# Patient Record
Sex: Female | Born: 1940 | Race: Black or African American | Hispanic: No | Smoking: Never smoker
Health system: Southern US, Community
[De-identification: ages and names within clinical notes are randomized; demographics above are authoritative.]

## PROBLEM LIST (undated history)

## (undated) DIAGNOSIS — E119 Type 2 diabetes mellitus without complications: Secondary | ICD-10-CM

## (undated) DIAGNOSIS — I10 Essential (primary) hypertension: Secondary | ICD-10-CM

## (undated) HISTORY — PX: ABDOMINAL HYSTERECTOMY: SHX81

---

## 2000-05-02 ENCOUNTER — Encounter: Payer: Self-pay | Admitting: Obstetrics

## 2000-05-02 ENCOUNTER — Encounter: Admission: RE | Admit: 2000-05-02 | Discharge: 2000-05-02 | Payer: Self-pay | Admitting: Obstetrics

## 2002-11-25 ENCOUNTER — Ambulatory Visit (HOSPITAL_COMMUNITY): Admission: RE | Admit: 2002-11-25 | Discharge: 2002-11-25 | Payer: Self-pay | Admitting: Gastroenterology

## 2002-12-02 ENCOUNTER — Encounter: Payer: Self-pay | Admitting: Gastroenterology

## 2002-12-02 ENCOUNTER — Encounter: Admission: RE | Admit: 2002-12-02 | Discharge: 2002-12-02 | Payer: Self-pay | Admitting: Gastroenterology

## 2007-08-13 ENCOUNTER — Encounter: Admission: RE | Admit: 2007-08-13 | Discharge: 2007-08-13 | Payer: Self-pay | Admitting: Internal Medicine

## 2008-08-13 ENCOUNTER — Encounter: Admission: RE | Admit: 2008-08-13 | Discharge: 2008-08-13 | Payer: Self-pay | Admitting: Internal Medicine

## 2008-10-23 ENCOUNTER — Emergency Department (HOSPITAL_COMMUNITY): Admission: EM | Admit: 2008-10-23 | Discharge: 2008-10-23 | Payer: Self-pay | Admitting: Emergency Medicine

## 2008-11-01 ENCOUNTER — Emergency Department (HOSPITAL_COMMUNITY): Admission: EM | Admit: 2008-11-01 | Discharge: 2008-11-01 | Payer: Self-pay | Admitting: Family Medicine

## 2008-11-20 ENCOUNTER — Emergency Department (HOSPITAL_COMMUNITY): Admission: EM | Admit: 2008-11-20 | Discharge: 2008-11-20 | Payer: Self-pay | Admitting: Family Medicine

## 2009-08-16 ENCOUNTER — Encounter: Admission: RE | Admit: 2009-08-16 | Discharge: 2009-08-16 | Payer: Self-pay | Admitting: Internal Medicine

## 2009-09-23 ENCOUNTER — Encounter: Admission: RE | Admit: 2009-09-23 | Discharge: 2009-09-23 | Payer: Self-pay | Admitting: Internal Medicine

## 2010-09-24 ENCOUNTER — Encounter: Payer: Self-pay | Admitting: Specialist

## 2011-01-19 NOTE — Op Note (Signed)
   NAME:  Kimberly Simmons, Kimberly Simmons                          ACCOUNT NO.:  [id number]   MEDICAL RECORD NO.:  [id number]                   PATIENT TYPE:  AMB   LOCATION:  ENDO                                 FACILITY:  [facility]   PHYSICIAN:  Anselmo Rod, M.D.               DATE OF BIRTH:  Mar 28, 1941   DATE OF PROCEDURE:  11/25/2002  DATE OF DISCHARGE:                                 OPERATIVE REPORT   PROCEDURE PERFORMED:  Colonoscopy.   ENDOSCOPIST:  Charna Elizabeth, M.D.   INSTRUMENT USED:  Olympus video colonoscope.   INDICATIONS FOR PROCEDURE:  The patient is a 70 year old African-American  female with a history of blood in stool.  Rule out colonic polyps, masses,  etc.   PREPROCEDURE PREPARATION:  Informed consent was procured from the patient.  The patient was fasted for eight hours prior to the procedure and prepped  with a bottle of magnesium citrate and a gallon of GoLYTELY the night prior  to the procedure.   PREPROCEDURE PHYSICAL:  The patient had stable vital signs.  Neck supple.  Chest clear to auscultation.  S1 and S2 regular.  No murmurs, rubs or  gallops, rales, rhonchi or wheezing.  Abdomen soft with normal bowel sounds.   DESCRIPTION OF PROCEDURE:  The patient was placed in left lateral decubitus  position and sedated with 50 mg of Demerol and 5 mg of Versed intravenously.  Once the patient was adequately sedated and maintained on low flow oxygen  and continuous cardiac monitoring, the Olympus video colonoscope was  advanced from the rectum to the cecum with difficulty.  There was a large  amount of residual stool in the colon.  Multiple washes were done.  There  were a few left-sided diverticula seen.  No masses, polyps, erosions or  ulcerations were appreciated.  The appendicular orifice and ileocecal valve  were clearly visualized and photographed.   IMPRESSION:  1. 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  2. Some  residual stool in the colon.  Small lesions could have been missed.    RECOMMENDATIONS:  An upper GI with small bowel follow-through has been  planned for the patient to complete her GI evaluation.  Further  recommendations will be made thereafter.                                                   Anselmo Rod, M.D.    JNM/MEDQ  D:  11/25/2002  T:  11/26/2002  Job:  [id number]   cc:   Merlene Laughter. Renae Gloss, M.D.  [address]  [address]  [place]  [place] [address]  Fax: [phone]

## 2013-04-11 ENCOUNTER — Encounter (HOSPITAL_COMMUNITY): Payer: Self-pay | Admitting: *Deleted

## 2013-04-11 ENCOUNTER — Emergency Department (HOSPITAL_COMMUNITY)
Admission: EM | Admit: 2013-04-11 | Discharge: 2013-04-11 | Disposition: A | Discharge status: Home / Self Care | Payer: 59 | Source: Home / Self Care | Attending: Family Medicine | Admitting: Family Medicine

## 2013-04-11 DIAGNOSIS — M5430 Sciatica, unspecified side: Secondary | ICD-10-CM

## 2013-04-11 HISTORY — DX: Essential (primary) hypertension: I10

## 2013-04-11 HISTORY — DX: Type 2 diabetes mellitus without complications: E11.9

## 2013-04-11 MED ORDER — HYDROCODONE-ACETAMINOPHEN 7.5-325 MG PO TABS: 1.0000 | ORAL_TABLET | Freq: Four times a day (QID) | ORAL | Status: DC | PRN

## 2013-04-11 NOTE — ED Provider Notes (Signed)
CSN: [id number]     Arrival date & time 04/11/13  1241 History     First MD Initiated Contact with Patient 04/11/13 1301     Chief Complaint  Patient presents with  . Back Pain   (Consider location/radiation/quality/duration/timing/severity/associated sxs/prior Treatment) HPI Comments: 72 year old female complaining of worsening back pain shooting down both legs for the past week.  this pain has been present for the past 2 years but has been a lot worse in the past week. She saw her primary care physician about this 3 weeks ago and was referred to a pain and spine specialist, she has an appointment scheduled for one month from now. However, she says the pain is getting to be unbearable and is keeping her from sleeping. The pain starts in the lower back and radiates through the buttocks down to the heel bilaterally. The pain is constant but is worse and by any activity. She denies any numbness or loss of bowel/bladder control. No recent injuries. No other systemic symptoms including fever, chills, diffuse back pain, nausea, vomiting, rash, or headache   Patient is a 72 y.o. female presenting with back pain.  Back Pain Associated symptoms: no abdominal pain, no chest pain, no dysuria, no fever, no pelvic pain and no weakness     Past Medical History  Diagnosis Date  . Hypertension   . Diabetes mellitus without complication    History reviewed. No pertinent past surgical history. No family history on file. History  Substance Use Topics  . Smoking status: Not on file  . Smokeless tobacco: Not on file  . Alcohol Use: No   OB History   Grav Para Term Preterm Abortions TAB SAB Ect Mult Living                 Review of Systems  Constitutional: Negative for fever and chills.  Eyes: Negative for visual disturbance.  Respiratory: Negative for cough and shortness of breath.   Cardiovascular: Negative for chest pain, palpitations and leg swelling.  Gastrointestinal: Negative for nausea,  vomiting, abdominal pain, diarrhea and constipation.  Endocrine: Negative for polydipsia and polyuria.  Genitourinary: Negative for dysuria, urgency, frequency, hematuria, flank pain and pelvic pain.  Musculoskeletal: Positive for myalgias and back pain. Negative for arthralgias.  Skin: Negative for rash.  Neurological: Negative for dizziness, weakness and light-headedness.    Allergies  Review of patient's allergies indicates no known allergies.  Home Medications   Current Outpatient Rx  Name  Route  Sig  Dispense  Refill  . Cholecalciferol (VITAMIN D PO)   Oral   Take by mouth.         . cloNIDine (CATAPRES) 0.1 MG tablet   Oral   Take 0.1 mg by mouth at bedtime.         . fish oil-omega-3 fatty acids 1000 MG capsule   Oral   Take 2 g by mouth daily.         Marland Kitchen METFORMIN HCL PO   Oral   Take by mouth.         . olmesartan-hydrochlorothiazide (BENICAR HCT) 40-25 MG per tablet   Oral   Take 1 tablet by mouth daily.         . Pregabalin (LYRICA PO)   Oral   Take by mouth.         Marland Kitchen UNKNOWN TO PATIENT      Bone health supplement         . HYDROcodone-acetaminophen (NORCO) 7.5-325 MG per  tablet   Oral   Take 1 tablet by mouth every 6 (six) hours as needed for pain.   20 tablet   0    BP 130/75  Pulse 78  Temp(Src) 98.5 F (36.9 C) (Oral)  SpO2 96% Physical Exam  Nursing note and vitals reviewed. Constitutional: She is oriented to person, place, and time. She appears well-developed and well-nourished. No distress.  Musculoskeletal:       Lumbar back: She exhibits decreased range of motion, bony tenderness and pain. She exhibits no swelling, no deformity and no spasm.  Positive SLR bilaterally, negative XSLR bilaterally   Neurological: She is alert and oriented to person, place, and time. She displays normal reflexes. She exhibits normal muscle tone. Coordination normal.  Skin: Skin is warm. She is not diaphoretic.  Psychiatric: She has a normal  mood and affect. Judgment normal.    ED Course   Procedures (including critical care time)  Labs Reviewed - No data to display No results found. 1. Sciatica, unspecified laterality     MDM  She already has a f/u appt with back specialist, she will call to try to get her appt moved up.  Will discharge with pain control.  She will also start taking her lyrica BID.     Meds ordered this encounter  Medications                                                                  . HYDROcodone-acetaminophen (NORCO) 7.5-325 MG per tablet    Sig: Take 1 tablet by mouth every 6 (six) hours as needed for pain.    Dispense:  20 tablet    Refill:  0    Graylon Good, PA-C 04/11/13 1333

## 2013-04-11 NOTE — ED Notes (Signed)
Denies injury.  C/O "nerve" pain in bilat legs x approx 2 yrs without work-up, but over past 2 wks low back pain radiating down bilat buttocks and legs (R>L) has gotten worse.  Has appt with Dr. Jana Hakim on Sept 3.  States unable to sleep well due to pain; painful laying down and sitting.  Denies any parasthesias.

## 2013-04-12 NOTE — ED Provider Notes (Signed)
Medical screening examination/treatment/procedure(s) were performed by non-physician practitioner and as supervising physician I was immediately available for consultation/collaboration.   [facility]; MD  Sharin Grave, MD 04/12/13 1010

## 2014-06-02 ENCOUNTER — Encounter (INDEPENDENT_AMBULATORY_CARE_PROVIDER_SITE_OTHER): Payer: Medicare Other | Admitting: Ophthalmology

## 2014-06-02 DIAGNOSIS — I10 Essential (primary) hypertension: Secondary | ICD-10-CM | POA: Diagnosis not present

## 2014-06-02 DIAGNOSIS — H35039 Hypertensive retinopathy, unspecified eye: Secondary | ICD-10-CM

## 2014-06-02 DIAGNOSIS — H2510 Age-related nuclear cataract, unspecified eye: Secondary | ICD-10-CM

## 2014-06-02 DIAGNOSIS — H43819 Vitreous degeneration, unspecified eye: Secondary | ICD-10-CM

## 2014-06-02 DIAGNOSIS — H348192 Central retinal vein occlusion, unspecified eye, stable: Secondary | ICD-10-CM

## 2014-06-30 ENCOUNTER — Encounter (INDEPENDENT_AMBULATORY_CARE_PROVIDER_SITE_OTHER): Payer: Medicare Other | Admitting: Ophthalmology

## 2014-06-30 DIAGNOSIS — H2513 Age-related nuclear cataract, bilateral: Secondary | ICD-10-CM

## 2014-06-30 DIAGNOSIS — H35033 Hypertensive retinopathy, bilateral: Secondary | ICD-10-CM | POA: Diagnosis not present

## 2014-06-30 DIAGNOSIS — I10 Essential (primary) hypertension: Secondary | ICD-10-CM | POA: Diagnosis not present

## 2014-06-30 DIAGNOSIS — H34812 Central retinal vein occlusion, left eye: Secondary | ICD-10-CM | POA: Diagnosis not present

## 2014-06-30 DIAGNOSIS — H43813 Vitreous degeneration, bilateral: Secondary | ICD-10-CM | POA: Diagnosis not present

## 2014-07-28 ENCOUNTER — Encounter (INDEPENDENT_AMBULATORY_CARE_PROVIDER_SITE_OTHER): Payer: Medicare Other | Admitting: Ophthalmology

## 2014-07-28 DIAGNOSIS — H34812 Central retinal vein occlusion, left eye: Secondary | ICD-10-CM | POA: Diagnosis not present

## 2014-07-28 DIAGNOSIS — H35033 Hypertensive retinopathy, bilateral: Secondary | ICD-10-CM

## 2014-07-28 DIAGNOSIS — I10 Essential (primary) hypertension: Secondary | ICD-10-CM | POA: Diagnosis not present

## 2014-07-28 DIAGNOSIS — H43813 Vitreous degeneration, bilateral: Secondary | ICD-10-CM

## 2014-08-31 ENCOUNTER — Encounter (INDEPENDENT_AMBULATORY_CARE_PROVIDER_SITE_OTHER): Payer: Medicare Other | Admitting: Ophthalmology

## 2014-08-31 DIAGNOSIS — H35033 Hypertensive retinopathy, bilateral: Secondary | ICD-10-CM | POA: Diagnosis not present

## 2014-08-31 DIAGNOSIS — H43813 Vitreous degeneration, bilateral: Secondary | ICD-10-CM

## 2014-08-31 DIAGNOSIS — H34811 Central retinal vein occlusion, right eye: Secondary | ICD-10-CM | POA: Diagnosis not present

## 2014-08-31 DIAGNOSIS — I10 Essential (primary) hypertension: Secondary | ICD-10-CM

## 2014-10-02 ENCOUNTER — Emergency Department (HOSPITAL_COMMUNITY)
Admission: EM | Admit: 2014-10-02 | Discharge: 2014-10-02 | Disposition: A | Discharge status: Home / Self Care | Payer: Commercial Managed Care - PPO | Source: Home / Self Care | Attending: Emergency Medicine | Admitting: Emergency Medicine

## 2014-10-02 ENCOUNTER — Encounter (HOSPITAL_COMMUNITY): Payer: Self-pay | Admitting: *Deleted

## 2014-10-02 DIAGNOSIS — J019 Acute sinusitis, unspecified: Secondary | ICD-10-CM

## 2014-10-02 MED ORDER — CEFDINIR 300 MG PO CAPS: 300.0000 mg | ORAL_CAPSULE | Freq: Two times a day (BID) | ORAL | Status: DC

## 2014-10-02 MED ORDER — FLUTICASONE PROPIONATE 50 MCG/ACT NA SUSP: 2.0000 | Freq: Every day | NASAL | Status: DC

## 2014-10-02 MED ORDER — BENZONATATE 200 MG PO CAPS: 200.0000 mg | ORAL_CAPSULE | Freq: Three times a day (TID) | ORAL | Status: DC | PRN

## 2014-10-02 NOTE — Discharge Instructions (Signed)

## 2014-10-02 NOTE — ED Provider Notes (Signed)
   Chief Complaint   URI   History of Present Illness   Kimberly Simmons is a 74 year old female who has had a one-week history of nasal congestion with clear, bloody drainage, headache, sinus pressure, sore throat, postnasal drainage, and cough productive of clear sputum. She denies any fever, chills, headache, stiff neck, difficulty breathing, or GI symptoms.  Review of Systems   Other than as noted above, the patient denies any of the following symptoms: Systemic:  No fevers, chills, sweats, or myalgias. Eye:  No redness or discharge. ENT:  No ear pain, headache, nasal congestion, drainage, sinus pressure, or sore throat. Neck:  No neck pain, stiffness, or swollen glands. Lungs:  No cough, sputum production, hemoptysis, wheezing, chest tightness, shortness of breath or chest pain. GI:  No abdominal pain, nausea, vomiting or diarrhea.  PMFSH   Past medical history, family history, social history, meds, and allergies were reviewed. She has high blood pressure and arthritis. She takes clonidine and Benicar.  Physical exam   Vital signs:  BP 188/81 mmHg  Pulse 83  Temp(Src) 98.8 F (37.1 C) (Oral)  Resp 16  SpO2 99% General:  Alert and oriented.  In no distress.  Skin warm and dry. Eye:  No conjunctival injection or drainage. Lids were normal. ENT:  TMs and canals were normal, without erythema or inflammation.  Nasal mucosa was clear and uncongested, without drainage.  Mucous membranes were moist.  Pharynx was clear with no exudate or drainage.  There were no oral ulcerations or lesions. Neck:  Supple, no adenopathy, tenderness or mass. Lungs:  No respiratory distress.  Lungs were clear to auscultation, without wheezes, rales or rhonchi.  Breath sounds were clear and equal bilaterally.  Heart:  Regular rhythm, without gallops, murmers or rubs. Skin:  Clear, warm, and dry, without rash or lesions.  Assessment     The encounter diagnosis was Acute sinusitis, recurrence not  specified, unspecified location.  There is no evidence of pneumonia, strep throat, otitis media.    Plan    1.  Meds:  The following meds were prescribed:   Discharge Medication List as of 10/02/2014 11:53 AM    START taking these medications   Details  benzonatate (TESSALON) 200 MG capsule Take 1 capsule (200 mg total) by mouth 3 (three) times daily as needed for cough., Starting 10/02/2014, Until Discontinued, Normal    cefdinir (OMNICEF) 300 MG capsule Take 1 capsule (300 mg total) by mouth 2 (two) times daily., Starting 10/02/2014, Until Discontinued, Normal    fluticasone (FLONASE) 50 MCG/ACT nasal spray Place 2 sprays into both nostrils daily., Starting 10/02/2014, Until Discontinued, Normal        2.  Patient Education/Counseling:  The patient was given appropriate handouts, self care instructions, and instructed in symptomatic relief.  Instructed to get extra fluids and extra rest.    3.  Follow up:  The patient was told to follow up here if no better in 3 to 4 days, or sooner if becoming worse in any way, and given some red flag symptoms such as increasing fever, difficulty breathing, chest pain, or persistent vomiting which would prompt immediate return.       Reuben Likesavid C Jacquilyn Seldon, MD 10/02/14 2209

## 2014-10-02 NOTE — ED Notes (Signed)
Pt  Reports  Symptoms  Of  A  sorethroat     Sneezing         Sinus  Drainage    /  Congestion        -       With a  Slight  Cough  As  Well       Symptoms  X  5  Days        Pt  Sitting upright on  Exam table  Speaking in  Complete  sentances  And  Is  In no  Severe  Distress

## 2014-10-07 ENCOUNTER — Encounter (INDEPENDENT_AMBULATORY_CARE_PROVIDER_SITE_OTHER): Payer: Medicare Other | Admitting: Ophthalmology

## 2014-10-07 DIAGNOSIS — H35033 Hypertensive retinopathy, bilateral: Secondary | ICD-10-CM

## 2014-10-07 DIAGNOSIS — H43813 Vitreous degeneration, bilateral: Secondary | ICD-10-CM | POA: Diagnosis not present

## 2014-10-07 DIAGNOSIS — I10 Essential (primary) hypertension: Secondary | ICD-10-CM

## 2014-10-07 DIAGNOSIS — H34811 Central retinal vein occlusion, right eye: Secondary | ICD-10-CM

## 2014-10-07 DIAGNOSIS — H2513 Age-related nuclear cataract, bilateral: Secondary | ICD-10-CM

## 2014-10-15 ENCOUNTER — Emergency Department (HOSPITAL_COMMUNITY)
Admission: EM | Admit: 2014-10-15 | Discharge: 2014-10-15 | Disposition: A | Discharge status: Home / Self Care | Payer: Medicare Other | Source: Home / Self Care | Attending: Emergency Medicine | Admitting: Emergency Medicine

## 2014-10-15 ENCOUNTER — Encounter (HOSPITAL_COMMUNITY): Payer: Self-pay | Admitting: Emergency Medicine

## 2014-10-15 DIAGNOSIS — J40 Bronchitis, not specified as acute or chronic: Secondary | ICD-10-CM | POA: Diagnosis not present

## 2014-10-15 MED ORDER — METHYLPREDNISOLONE SODIUM SUCC 125 MG IJ SOLR
INTRAMUSCULAR | Status: AC
  Filled 2014-10-15: qty 2

## 2014-10-15 MED ORDER — METHYLPREDNISOLONE SODIUM SUCC 125 MG IJ SOLR
60.0000 mg | Freq: Once | INTRAMUSCULAR | Status: AC
  Administered 2014-10-15: 60 mg via INTRAMUSCULAR

## 2014-10-15 MED ORDER — PREDNISONE 50 MG PO TABS: ORAL_TABLET | ORAL | Status: DC

## 2014-10-15 NOTE — ED Provider Notes (Signed)
CSN: [id number]     Arrival date & time 10/15/14  1111 History   First MD Initiated Contact with Patient 10/15/14 1129     Chief Complaint  Patient presents with  . Nasal Congestion   (Consider location/radiation/quality/duration/timing/severity/associated sxs/prior Treatment) HPI  She is a 74 year old woman here for follow-up of cough and congestion. Her symptoms have been ongoing for 2-3 weeks. She was seen here about 10 days ago and treated for sinusitis with Omnicef and Flonase. She states her nasal symptoms are somewhat better, but her cough is persistent. She also describes chest congestion. She has just started using the Tessalon prescribed previously. No fevers or chills. No nausea or vomiting.  Past Medical History  Diagnosis Date  . Hypertension   . Diabetes mellitus without complication    History reviewed. No pertinent past surgical history. History reviewed. No pertinent family history. History  Substance Use Topics  . Smoking status: Never Smoker   . Smokeless tobacco: Not on file  . Alcohol Use: No   OB History    No data available     Review of Systems  Constitutional: Negative for fever and chills.  HENT: Positive for congestion, postnasal drip and rhinorrhea. Negative for sore throat.   Respiratory: Positive for cough. Negative for shortness of breath.   Gastrointestinal: Negative for nausea and vomiting.    Allergies  Review of patient's allergies indicates no known allergies.  Home Medications   Prior to Admission medications   Medication Sig Start Date End Date Taking? Authorizing Provider  benzonatate (TESSALON) 200 MG capsule Take 1 capsule (200 mg total) by mouth 3 (three) times daily as needed for cough. 10/02/14   Reuben Likes, MD  cefdinir (OMNICEF) 300 MG capsule Take 1 capsule (300 mg total) by mouth 2 (two) times daily. 10/02/14   Reuben Likes, MD  Cholecalciferol (VITAMIN D PO) Take by mouth.    Historical Provider, MD  cloNIDine  (CATAPRES) 0.1 MG tablet Take 0.1 mg by mouth at bedtime.    Historical Provider, MD  fish oil-omega-3 fatty acids 1000 MG capsule Take 2 g by mouth daily.    Historical Provider, MD  fluticasone (FLONASE) 50 MCG/ACT nasal spray Place 2 sprays into both nostrils daily. 10/02/14   Reuben Likes, MD  HYDROcodone-acetaminophen (NORCO) 7.5-325 MG per tablet Take 1 tablet by mouth every 6 (six) hours as needed for pain. 04/11/13   Adrian Blackwater Baker, PA-C  METFORMIN HCL PO Take by mouth.    Historical Provider, MD  olmesartan-hydrochlorothiazide (BENICAR HCT) 40-25 MG per tablet Take 1 tablet by mouth daily.    Historical Provider, MD  predniSONE (DELTASONE) 50 MG tablet Take 1 pill daily for 5 days. 10/15/14   Charm Rings, MD  Pregabalin (LYRICA PO) Take by mouth.    Historical Provider, MD  UNKNOWN TO PATIENT Bone health supplement    Historical Provider, MD   BP 198/98 mmHg  Pulse 81  Temp(Src) 98.3 F (36.8 C) (Oral)  Resp 16  SpO2 96% Physical Exam  Constitutional: She is oriented to person, place, and time. She appears well-developed and well-nourished. No distress.  HENT:  Head: Normocephalic and atraumatic.  Nose: Nose normal.  Mouth/Throat: Oropharynx is clear and moist. No oropharyngeal exudate.  Neck: Neck supple.  Cardiovascular: Normal rate, regular rhythm and normal heart sounds.   No murmur heard. Pulmonary/Chest: Effort normal and breath sounds normal. No respiratory distress. She has no wheezes. She has no rales.  Lymphadenopathy:  She has no cervical adenopathy.  Neurological: She is alert and oriented to person, place, and time.    ED Course  Procedures (including critical care time) Labs Review Labs Reviewed - No data to display  Imaging Review No results found.   MDM   1. Bronchitis    I suspect her lingering symptoms are secondary to post infection inflammation. As her diabetes is extremely well-controlled, she reports an A1c of 5.9%, will treat with a  five-day course of prednisone. She will continue to use Tessalon as needed for cough. Follow-up if no improvement in 2-3 days. Also recommended follow-up with her PCP in the next few weeks for her blood pressure.    Charm RingsErin J Darielys Giglia, MD 10/15/14 [phone]

## 2014-10-15 NOTE — ED Notes (Signed)
Pt is here today because she has nasal congestion and URI symptoms for the last 2 weeks, pt said that she is not getting any better since starting treatment last week

## 2014-10-15 NOTE — Discharge Instructions (Signed)
You likely have some lingering inflammation in your airways. Take prednisone 1 pill daily for 5 days. Use the Tessalon as needed for cough. Follow-up if no improvement in 2-3 days. Please follow-up with your regular doctor in the next few weeks for your blood pressure.

## 2014-11-25 ENCOUNTER — Encounter (INDEPENDENT_AMBULATORY_CARE_PROVIDER_SITE_OTHER): Payer: Medicare Other | Admitting: Ophthalmology

## 2014-11-25 DIAGNOSIS — H43813 Vitreous degeneration, bilateral: Secondary | ICD-10-CM

## 2014-11-25 DIAGNOSIS — H35033 Hypertensive retinopathy, bilateral: Secondary | ICD-10-CM | POA: Diagnosis not present

## 2014-11-25 DIAGNOSIS — H34812 Central retinal vein occlusion, left eye: Secondary | ICD-10-CM | POA: Diagnosis not present

## 2014-11-25 DIAGNOSIS — I10 Essential (primary) hypertension: Secondary | ICD-10-CM

## 2014-11-25 DIAGNOSIS — H2513 Age-related nuclear cataract, bilateral: Secondary | ICD-10-CM | POA: Diagnosis not present

## 2014-12-10 ENCOUNTER — Emergency Department (INDEPENDENT_AMBULATORY_CARE_PROVIDER_SITE_OTHER)
Admission: EM | Admit: 2014-12-10 | Discharge: 2014-12-10 | Disposition: A | Discharge status: Home / Self Care | Payer: Medicare Other | Source: Home / Self Care | Attending: Family Medicine | Admitting: Family Medicine

## 2014-12-10 ENCOUNTER — Encounter (HOSPITAL_COMMUNITY): Payer: Self-pay

## 2014-12-10 DIAGNOSIS — I10 Essential (primary) hypertension: Secondary | ICD-10-CM | POA: Diagnosis not present

## 2014-12-10 LAB — POCT I-STAT, CHEM 8
BUN: 22 mg/dL (ref 6–23)
CALCIUM ION: 1.25 mmol/L (ref 1.13–1.30)
CREATININE: 1.2 mg/dL — AB (ref 0.50–1.10)
Chloride: 101 mmol/L (ref 96–112)
Glucose, Bld: 92 mg/dL (ref 70–99)
HCT: 42 % (ref 36.0–46.0)
Hemoglobin: 14.3 g/dL (ref 12.0–15.0)
Potassium: 3.8 mmol/L (ref 3.5–5.1)
Sodium: 141 mmol/L (ref 135–145)
TCO2: 27 mmol/L (ref 0–100)

## 2014-12-10 MED ORDER — AMLODIPINE BESYLATE 10 MG PO TABS: 10.0000 mg | ORAL_TABLET | Freq: Every day | ORAL | Status: AC

## 2014-12-10 MED ORDER — AZILSARTAN-CHLORTHALIDONE 40-25 MG PO TABS: 1.0000 | ORAL_TABLET | Freq: Every day | ORAL | Status: DC

## 2014-12-10 NOTE — ED Provider Notes (Signed)
Kimberly Simmons is a 74 y.o. female who presents to Urgent Care today for hypertension. Patient has a history of hypertension. She was recently started on Edarbyclor 40/25. She continues clonidine. She is asymptomatic but notes that her blood pressures not typically well controlled at home. No chest pains palpitations or shortness of breath. She attempted to follow-up with her primary care provider for routine management of her blood pressure. Her primary care provider was not in the office and she was told to present to urgent care. She feels well otherwise. Additionally she takes meloxicam daily for pain management.   Past Medical History  Diagnosis Date  . Hypertension   . Diabetes mellitus without complication    History reviewed. No pertinent past surgical history. History  Substance Use Topics  . Smoking status: Never Smoker   . Smokeless tobacco: Not on file  . Alcohol Use: No   ROS as above Medications: No current facility-administered medications for this encounter.   Current Outpatient Prescriptions  Medication Sig Dispense Refill  . fenofibrate (TRICOR) 145 MG tablet Take 145 mg by mouth daily.    . meloxicam (MOBIC) 7.5 MG tablet Take 7.5 mg by mouth daily.    Marland Kitchen. amLODipine (NORVASC) 10 MG tablet Take 1 tablet (10 mg total) by mouth daily. 30 tablet 0  . Azilsartan-Chlorthalidone (EDARBYCLOR) 40-25 MG TABS Take 1 tablet by mouth daily. 30 tablet 0  . Cholecalciferol (VITAMIN D PO) Take by mouth.    . cloNIDine (CATAPRES) 0.1 MG tablet Take 0.1 mg by mouth at bedtime.    . fish oil-omega-3 fatty acids 1000 MG capsule Take 2 g by mouth daily.    Marland Kitchen. METFORMIN HCL PO Take by mouth.    . Pregabalin (LYRICA PO) Take by mouth.    Marland Kitchen. UNKNOWN TO PATIENT Bone health supplement     No Known Allergies   Exam:  BP 203/110 mmHg  Pulse 79  Temp(Src) 99.4 F (37.4 C) (Oral)  Resp 14  SpO2 95% Gen: Well NAD HEENT: EOMI,  MMM Lungs: Normal work of breathing. CTABL Heart: RRR no  MRG Abd: NABS, Soft. Nondistended, Nontender Exts: Brisk capillary refill, warm and well perfused.   Results for orders placed or performed during the hospital encounter of 12/10/14 (from the past 24 hour(s))  I-STAT, chem 8     Status: Abnormal   Collection Time: 12/10/14 12:57 PM  Result Value Ref Range   Sodium 141 135 - 145 mmol/L   Potassium 3.8 3.5 - 5.1 mmol/L   Chloride 101 96 - 112 mmol/L   BUN 22 6 - 23 mg/dL   Creatinine, Ser 1.611.20 (H) 0.50 - 1.10 mg/dL   Glucose, Bld 92 70 - 99 mg/dL   Calcium, Ion 0.961.25 0.451.13 - 1.30 mmol/L   TCO2 27 0 - 100 mmol/L   Hemoglobin 14.3 12.0 - 15.0 g/dL   HCT 40.942.0 81.136.0 - 91.446.0 %   No results found.  Assessment and Plan: [age] y.o. female with hypertension not well-controlled. Continue medications. Add Norvasc. Follow-up with PCP.  Discussed warning signs or symptoms. Please see discharge instructions. Patient expresses understanding.     Kimberly BongEvan S Milyn Stapleton, MD 12/10/14 1322

## 2014-12-10 NOTE — ED Notes (Signed)
Has been on a new medication for her BP, which she is concerned is not working for her as expected. Her PCP is not in the office today

## 2014-12-10 NOTE — Discharge Instructions (Signed)
Thank you for coming in today. Continue blood pressure medications.  START Amlodipine.  Return as needed.  Call or go to the emergency room if you get worse, have trouble breathing, have chest pains, or palpitations.  Follow up with your doctor.  Hypertension Hypertension, commonly called high blood pressure, is when the force of blood pumping through your arteries is too strong. Your arteries are the blood vessels that carry blood from your heart throughout your body. A blood pressure reading consists of a higher number over a lower number, such as 110/72. The higher number (systolic) is the pressure inside your arteries when your heart pumps. The lower number (diastolic) is the pressure inside your arteries when your heart relaxes. Ideally you want your blood pressure below 120/80. Hypertension forces your heart to work harder to pump blood. Your arteries may become narrow or stiff. Having hypertension puts you at risk for heart disease, stroke, and other problems.  RISK FACTORS Some risk factors for high blood pressure are controllable. Others are not.  Risk factors you cannot control include:   Race. You may be at higher risk if you are African American.  Age. Risk increases with age.  Gender. Men are at higher risk than women before age 74 years. After age 74, women are at higher risk than men. Risk factors you can control include:  Not getting enough exercise or physical activity.  Being overweight.  Getting too much fat, sugar, calories, or salt in your diet.  Drinking too much alcohol. SIGNS AND SYMPTOMS Hypertension does not usually cause signs or symptoms. Extremely high blood pressure (hypertensive crisis) may cause headache, anxiety, shortness of breath, and nosebleed. DIAGNOSIS  To check if you have hypertension, your health care provider will measure your blood pressure while you are seated, with your arm held at the level of your heart. It should be measured at least  twice using the same arm. Certain conditions can cause a difference in blood pressure between your right and left arms. A blood pressure reading that is higher than normal on one occasion does not mean that you need treatment. If one blood pressure reading is high, ask your health care provider about having it checked again. TREATMENT  Treating high blood pressure includes making lifestyle changes and possibly taking medicine. Living a healthy lifestyle can help lower high blood pressure. You may need to change some of your habits. Lifestyle changes may include:  Following the DASH diet. This diet is high in fruits, vegetables, and whole grains. It is low in salt, red meat, and added sugars.  Getting at least 2 hours of brisk physical activity every week.  Losing weight if necessary.  Not smoking.  Limiting alcoholic beverages.  Learning ways to reduce stress. If lifestyle changes are not enough to get your blood pressure under control, your health care provider may prescribe medicine. You may need to take more than one. Work closely with your health care provider to understand the risks and benefits. HOME CARE INSTRUCTIONS  Have your blood pressure rechecked as directed by your health care provider.   Take medicines only as directed by your health care provider. Follow the directions carefully. Blood pressure medicines must be taken as prescribed. The medicine does not work as well when you skip doses. Skipping doses also puts you at risk for problems.   Do not smoke.   Monitor your blood pressure at home as directed by your health care provider. SEEK MEDICAL CARE IF:   You  think you are having a reaction to medicines taken.  You have recurrent headaches or feel dizzy.  You have swelling in your ankles.  You have trouble with your vision. SEEK IMMEDIATE MEDICAL CARE IF:  You develop a severe headache or confusion.  You have unusual weakness, numbness, or feel  faint.  You have severe chest or abdominal pain.  You vomit repeatedly.  You have trouble breathing. MAKE SURE YOU:   Understand these instructions.  Will watch your condition.  Will get help right away if you are not doing well or get worse. Document Released: 08/20/2005 Document Revised: 01/04/2014 Document Reviewed: 06/12/2013 [facility] Patient Information 2015 [place], [place]. This information is not intended to replace advice given to you by your health care provider. Make sure you discuss any questions you have with your health care provider.

## 2015-01-20 ENCOUNTER — Encounter (INDEPENDENT_AMBULATORY_CARE_PROVIDER_SITE_OTHER): Payer: Medicare Other | Admitting: Ophthalmology

## 2015-01-20 DIAGNOSIS — H35033 Hypertensive retinopathy, bilateral: Secondary | ICD-10-CM | POA: Diagnosis not present

## 2015-01-20 DIAGNOSIS — H43813 Vitreous degeneration, bilateral: Secondary | ICD-10-CM

## 2015-01-20 DIAGNOSIS — H2513 Age-related nuclear cataract, bilateral: Secondary | ICD-10-CM | POA: Diagnosis not present

## 2015-01-20 DIAGNOSIS — H34812 Central retinal vein occlusion, left eye: Secondary | ICD-10-CM | POA: Diagnosis not present

## 2015-01-20 DIAGNOSIS — I10 Essential (primary) hypertension: Secondary | ICD-10-CM | POA: Diagnosis not present

## 2015-03-24 ENCOUNTER — Encounter (INDEPENDENT_AMBULATORY_CARE_PROVIDER_SITE_OTHER): Payer: Medicare Other | Admitting: Ophthalmology

## 2015-03-24 DIAGNOSIS — H2513 Age-related nuclear cataract, bilateral: Secondary | ICD-10-CM | POA: Diagnosis not present

## 2015-03-24 DIAGNOSIS — H35033 Hypertensive retinopathy, bilateral: Secondary | ICD-10-CM | POA: Diagnosis not present

## 2015-03-24 DIAGNOSIS — H34812 Central retinal vein occlusion, left eye: Secondary | ICD-10-CM | POA: Diagnosis not present

## 2015-03-24 DIAGNOSIS — H43813 Vitreous degeneration, bilateral: Secondary | ICD-10-CM | POA: Diagnosis not present

## 2015-03-24 DIAGNOSIS — I10 Essential (primary) hypertension: Secondary | ICD-10-CM

## 2015-06-01 ENCOUNTER — Encounter (INDEPENDENT_AMBULATORY_CARE_PROVIDER_SITE_OTHER): Payer: Medicare Other | Admitting: Ophthalmology

## 2015-06-01 DIAGNOSIS — H43813 Vitreous degeneration, bilateral: Secondary | ICD-10-CM | POA: Diagnosis not present

## 2015-06-01 DIAGNOSIS — H2513 Age-related nuclear cataract, bilateral: Secondary | ICD-10-CM

## 2015-06-01 DIAGNOSIS — H34812 Central retinal vein occlusion, left eye: Secondary | ICD-10-CM

## 2015-06-01 DIAGNOSIS — I10 Essential (primary) hypertension: Secondary | ICD-10-CM

## 2015-06-01 DIAGNOSIS — H35033 Hypertensive retinopathy, bilateral: Secondary | ICD-10-CM | POA: Diagnosis not present

## 2015-08-03 ENCOUNTER — Encounter (INDEPENDENT_AMBULATORY_CARE_PROVIDER_SITE_OTHER): Payer: Medicare Other | Admitting: Ophthalmology

## 2015-08-03 DIAGNOSIS — I10 Essential (primary) hypertension: Secondary | ICD-10-CM

## 2015-08-03 DIAGNOSIS — H34812 Central retinal vein occlusion, left eye, with macular edema: Secondary | ICD-10-CM | POA: Diagnosis not present

## 2015-08-03 DIAGNOSIS — H2513 Age-related nuclear cataract, bilateral: Secondary | ICD-10-CM | POA: Diagnosis not present

## 2015-08-03 DIAGNOSIS — H35033 Hypertensive retinopathy, bilateral: Secondary | ICD-10-CM | POA: Diagnosis not present

## 2015-08-03 DIAGNOSIS — H43813 Vitreous degeneration, bilateral: Secondary | ICD-10-CM | POA: Diagnosis not present

## 2015-10-19 ENCOUNTER — Encounter (INDEPENDENT_AMBULATORY_CARE_PROVIDER_SITE_OTHER): Payer: Medicare Other | Admitting: Ophthalmology

## 2015-10-19 DIAGNOSIS — H43813 Vitreous degeneration, bilateral: Secondary | ICD-10-CM

## 2015-10-19 DIAGNOSIS — H2513 Age-related nuclear cataract, bilateral: Secondary | ICD-10-CM

## 2015-10-19 DIAGNOSIS — I10 Essential (primary) hypertension: Secondary | ICD-10-CM | POA: Diagnosis not present

## 2015-10-19 DIAGNOSIS — H35033 Hypertensive retinopathy, bilateral: Secondary | ICD-10-CM | POA: Diagnosis not present

## 2015-10-19 DIAGNOSIS — H34812 Central retinal vein occlusion, left eye, with macular edema: Secondary | ICD-10-CM

## 2015-11-17 DIAGNOSIS — E7800 Pure hypercholesterolemia, unspecified: Secondary | ICD-10-CM | POA: Insufficient documentation

## 2015-11-30 ENCOUNTER — Encounter (INDEPENDENT_AMBULATORY_CARE_PROVIDER_SITE_OTHER): Payer: Medicare Other | Admitting: Ophthalmology

## 2015-11-30 DIAGNOSIS — H43813 Vitreous degeneration, bilateral: Secondary | ICD-10-CM | POA: Diagnosis not present

## 2015-11-30 DIAGNOSIS — H35033 Hypertensive retinopathy, bilateral: Secondary | ICD-10-CM

## 2015-11-30 DIAGNOSIS — H34812 Central retinal vein occlusion, left eye, with macular edema: Secondary | ICD-10-CM | POA: Diagnosis not present

## 2015-11-30 DIAGNOSIS — I10 Essential (primary) hypertension: Secondary | ICD-10-CM

## 2015-11-30 DIAGNOSIS — H2513 Age-related nuclear cataract, bilateral: Secondary | ICD-10-CM | POA: Diagnosis not present

## 2016-01-18 ENCOUNTER — Encounter (INDEPENDENT_AMBULATORY_CARE_PROVIDER_SITE_OTHER): Payer: Medicare Other | Admitting: Ophthalmology

## 2016-01-18 DIAGNOSIS — H35033 Hypertensive retinopathy, bilateral: Secondary | ICD-10-CM | POA: Diagnosis not present

## 2016-01-18 DIAGNOSIS — H43813 Vitreous degeneration, bilateral: Secondary | ICD-10-CM

## 2016-01-18 DIAGNOSIS — I10 Essential (primary) hypertension: Secondary | ICD-10-CM

## 2016-01-18 DIAGNOSIS — H2513 Age-related nuclear cataract, bilateral: Secondary | ICD-10-CM

## 2016-01-18 DIAGNOSIS — H34812 Central retinal vein occlusion, left eye, with macular edema: Secondary | ICD-10-CM | POA: Diagnosis not present

## 2016-03-08 ENCOUNTER — Encounter (INDEPENDENT_AMBULATORY_CARE_PROVIDER_SITE_OTHER): Payer: Medicare Other | Admitting: Ophthalmology

## 2016-03-19 ENCOUNTER — Encounter (INDEPENDENT_AMBULATORY_CARE_PROVIDER_SITE_OTHER): Payer: Medicare Other | Admitting: Ophthalmology

## 2016-03-19 DIAGNOSIS — H35033 Hypertensive retinopathy, bilateral: Secondary | ICD-10-CM | POA: Diagnosis not present

## 2016-03-19 DIAGNOSIS — H34812 Central retinal vein occlusion, left eye, with macular edema: Secondary | ICD-10-CM | POA: Diagnosis not present

## 2016-03-19 DIAGNOSIS — I10 Essential (primary) hypertension: Secondary | ICD-10-CM

## 2016-03-19 DIAGNOSIS — H43813 Vitreous degeneration, bilateral: Secondary | ICD-10-CM | POA: Diagnosis not present

## 2016-04-30 ENCOUNTER — Encounter (INDEPENDENT_AMBULATORY_CARE_PROVIDER_SITE_OTHER): Payer: Medicare Other | Admitting: Ophthalmology

## 2016-05-09 ENCOUNTER — Encounter (INDEPENDENT_AMBULATORY_CARE_PROVIDER_SITE_OTHER): Payer: Medicare Other | Admitting: Ophthalmology

## 2016-05-09 DIAGNOSIS — H2513 Age-related nuclear cataract, bilateral: Secondary | ICD-10-CM | POA: Diagnosis not present

## 2016-05-09 DIAGNOSIS — H43813 Vitreous degeneration, bilateral: Secondary | ICD-10-CM | POA: Diagnosis not present

## 2016-05-09 DIAGNOSIS — H35033 Hypertensive retinopathy, bilateral: Secondary | ICD-10-CM

## 2016-05-09 DIAGNOSIS — I10 Essential (primary) hypertension: Secondary | ICD-10-CM | POA: Diagnosis not present

## 2016-05-09 DIAGNOSIS — H34812 Central retinal vein occlusion, left eye, with macular edema: Secondary | ICD-10-CM

## 2016-06-20 ENCOUNTER — Encounter (INDEPENDENT_AMBULATORY_CARE_PROVIDER_SITE_OTHER): Payer: Medicare Other | Admitting: Ophthalmology

## 2016-06-20 DIAGNOSIS — H35033 Hypertensive retinopathy, bilateral: Secondary | ICD-10-CM

## 2016-06-20 DIAGNOSIS — I10 Essential (primary) hypertension: Secondary | ICD-10-CM

## 2016-06-20 DIAGNOSIS — H34812 Central retinal vein occlusion, left eye, with macular edema: Secondary | ICD-10-CM

## 2016-06-20 DIAGNOSIS — H43813 Vitreous degeneration, bilateral: Secondary | ICD-10-CM

## 2016-08-01 ENCOUNTER — Encounter (INDEPENDENT_AMBULATORY_CARE_PROVIDER_SITE_OTHER): Payer: Medicare Other | Admitting: Ophthalmology

## 2016-08-01 DIAGNOSIS — I10 Essential (primary) hypertension: Secondary | ICD-10-CM | POA: Diagnosis not present

## 2016-08-01 DIAGNOSIS — H35033 Hypertensive retinopathy, bilateral: Secondary | ICD-10-CM | POA: Diagnosis not present

## 2016-08-01 DIAGNOSIS — H43813 Vitreous degeneration, bilateral: Secondary | ICD-10-CM

## 2016-08-01 DIAGNOSIS — H34812 Central retinal vein occlusion, left eye, with macular edema: Secondary | ICD-10-CM | POA: Diagnosis not present

## 2016-09-12 ENCOUNTER — Encounter (INDEPENDENT_AMBULATORY_CARE_PROVIDER_SITE_OTHER): Payer: Medicare Other | Admitting: Ophthalmology

## 2016-09-12 DIAGNOSIS — H35033 Hypertensive retinopathy, bilateral: Secondary | ICD-10-CM | POA: Diagnosis not present

## 2016-09-12 DIAGNOSIS — H34812 Central retinal vein occlusion, left eye, with macular edema: Secondary | ICD-10-CM | POA: Diagnosis not present

## 2016-09-12 DIAGNOSIS — H43813 Vitreous degeneration, bilateral: Secondary | ICD-10-CM | POA: Diagnosis not present

## 2016-09-12 DIAGNOSIS — I10 Essential (primary) hypertension: Secondary | ICD-10-CM

## 2016-09-12 DIAGNOSIS — H2513 Age-related nuclear cataract, bilateral: Secondary | ICD-10-CM | POA: Diagnosis not present

## 2016-10-24 ENCOUNTER — Encounter (INDEPENDENT_AMBULATORY_CARE_PROVIDER_SITE_OTHER): Payer: Medicare Other | Admitting: Ophthalmology

## 2016-10-24 DIAGNOSIS — I10 Essential (primary) hypertension: Secondary | ICD-10-CM | POA: Diagnosis not present

## 2016-10-24 DIAGNOSIS — H34812 Central retinal vein occlusion, left eye, with macular edema: Secondary | ICD-10-CM

## 2016-10-24 DIAGNOSIS — H43813 Vitreous degeneration, bilateral: Secondary | ICD-10-CM | POA: Diagnosis not present

## 2016-10-24 DIAGNOSIS — H35033 Hypertensive retinopathy, bilateral: Secondary | ICD-10-CM

## 2016-12-10 ENCOUNTER — Encounter (INDEPENDENT_AMBULATORY_CARE_PROVIDER_SITE_OTHER): Payer: Medicare Other | Admitting: Ophthalmology

## 2016-12-10 DIAGNOSIS — H34812 Central retinal vein occlusion, left eye, with macular edema: Secondary | ICD-10-CM | POA: Diagnosis not present

## 2016-12-10 DIAGNOSIS — H2513 Age-related nuclear cataract, bilateral: Secondary | ICD-10-CM

## 2016-12-10 DIAGNOSIS — H43813 Vitreous degeneration, bilateral: Secondary | ICD-10-CM

## 2016-12-10 DIAGNOSIS — H35033 Hypertensive retinopathy, bilateral: Secondary | ICD-10-CM

## 2016-12-10 DIAGNOSIS — I10 Essential (primary) hypertension: Secondary | ICD-10-CM

## 2017-01-21 ENCOUNTER — Encounter (INDEPENDENT_AMBULATORY_CARE_PROVIDER_SITE_OTHER): Payer: Medicare Other | Admitting: Ophthalmology

## 2017-01-21 DIAGNOSIS — H43813 Vitreous degeneration, bilateral: Secondary | ICD-10-CM | POA: Diagnosis not present

## 2017-01-21 DIAGNOSIS — H34812 Central retinal vein occlusion, left eye, with macular edema: Secondary | ICD-10-CM | POA: Diagnosis not present

## 2017-01-21 DIAGNOSIS — H35033 Hypertensive retinopathy, bilateral: Secondary | ICD-10-CM | POA: Diagnosis not present

## 2017-01-21 DIAGNOSIS — I10 Essential (primary) hypertension: Secondary | ICD-10-CM

## 2017-01-24 DIAGNOSIS — I10 Essential (primary) hypertension: Secondary | ICD-10-CM | POA: Insufficient documentation

## 2017-01-24 DIAGNOSIS — E119 Type 2 diabetes mellitus without complications: Secondary | ICD-10-CM | POA: Insufficient documentation

## 2017-03-04 ENCOUNTER — Encounter (INDEPENDENT_AMBULATORY_CARE_PROVIDER_SITE_OTHER): Payer: Medicare Other | Admitting: Ophthalmology

## 2017-03-04 DIAGNOSIS — H2513 Age-related nuclear cataract, bilateral: Secondary | ICD-10-CM | POA: Diagnosis not present

## 2017-03-04 DIAGNOSIS — H34812 Central retinal vein occlusion, left eye, with macular edema: Secondary | ICD-10-CM | POA: Diagnosis not present

## 2017-03-04 DIAGNOSIS — I10 Essential (primary) hypertension: Secondary | ICD-10-CM | POA: Diagnosis not present

## 2017-03-04 DIAGNOSIS — H43813 Vitreous degeneration, bilateral: Secondary | ICD-10-CM

## 2017-03-04 DIAGNOSIS — H35033 Hypertensive retinopathy, bilateral: Secondary | ICD-10-CM | POA: Diagnosis not present

## 2017-04-15 ENCOUNTER — Encounter (INDEPENDENT_AMBULATORY_CARE_PROVIDER_SITE_OTHER): Payer: Medicare Other | Admitting: Ophthalmology

## 2017-04-15 DIAGNOSIS — I10 Essential (primary) hypertension: Secondary | ICD-10-CM

## 2017-04-15 DIAGNOSIS — H35033 Hypertensive retinopathy, bilateral: Secondary | ICD-10-CM | POA: Diagnosis not present

## 2017-04-15 DIAGNOSIS — H43813 Vitreous degeneration, bilateral: Secondary | ICD-10-CM | POA: Diagnosis not present

## 2017-04-15 DIAGNOSIS — H34812 Central retinal vein occlusion, left eye, with macular edema: Secondary | ICD-10-CM | POA: Diagnosis not present

## 2017-04-15 DIAGNOSIS — H2513 Age-related nuclear cataract, bilateral: Secondary | ICD-10-CM

## 2017-05-27 ENCOUNTER — Encounter (INDEPENDENT_AMBULATORY_CARE_PROVIDER_SITE_OTHER): Payer: Medicare Other | Admitting: Ophthalmology

## 2017-05-27 DIAGNOSIS — I10 Essential (primary) hypertension: Secondary | ICD-10-CM | POA: Diagnosis not present

## 2017-05-27 DIAGNOSIS — H2511 Age-related nuclear cataract, right eye: Secondary | ICD-10-CM

## 2017-05-27 DIAGNOSIS — H43813 Vitreous degeneration, bilateral: Secondary | ICD-10-CM | POA: Diagnosis not present

## 2017-05-27 DIAGNOSIS — H35033 Hypertensive retinopathy, bilateral: Secondary | ICD-10-CM

## 2017-05-27 DIAGNOSIS — H34812 Central retinal vein occlusion, left eye, with macular edema: Secondary | ICD-10-CM | POA: Diagnosis not present

## 2017-07-08 ENCOUNTER — Encounter (INDEPENDENT_AMBULATORY_CARE_PROVIDER_SITE_OTHER): Payer: Medicare Other | Admitting: Ophthalmology

## 2017-07-08 DIAGNOSIS — H34812 Central retinal vein occlusion, left eye, with macular edema: Secondary | ICD-10-CM | POA: Diagnosis not present

## 2017-07-08 DIAGNOSIS — H35033 Hypertensive retinopathy, bilateral: Secondary | ICD-10-CM | POA: Diagnosis not present

## 2017-07-08 DIAGNOSIS — H43813 Vitreous degeneration, bilateral: Secondary | ICD-10-CM | POA: Diagnosis not present

## 2017-07-08 DIAGNOSIS — I10 Essential (primary) hypertension: Secondary | ICD-10-CM | POA: Diagnosis not present

## 2017-08-21 ENCOUNTER — Encounter (INDEPENDENT_AMBULATORY_CARE_PROVIDER_SITE_OTHER): Payer: Medicare Other | Admitting: Ophthalmology

## 2017-08-21 DIAGNOSIS — H35033 Hypertensive retinopathy, bilateral: Secondary | ICD-10-CM

## 2017-08-21 DIAGNOSIS — H34812 Central retinal vein occlusion, left eye, with macular edema: Secondary | ICD-10-CM

## 2017-08-21 DIAGNOSIS — H43813 Vitreous degeneration, bilateral: Secondary | ICD-10-CM | POA: Diagnosis not present

## 2017-08-21 DIAGNOSIS — H2511 Age-related nuclear cataract, right eye: Secondary | ICD-10-CM

## 2017-08-21 DIAGNOSIS — I10 Essential (primary) hypertension: Secondary | ICD-10-CM

## 2017-10-03 ENCOUNTER — Encounter (INDEPENDENT_AMBULATORY_CARE_PROVIDER_SITE_OTHER): Payer: Medicare Other | Admitting: Ophthalmology

## 2017-10-03 DIAGNOSIS — H35033 Hypertensive retinopathy, bilateral: Secondary | ICD-10-CM

## 2017-10-03 DIAGNOSIS — H34812 Central retinal vein occlusion, left eye, with macular edema: Secondary | ICD-10-CM | POA: Diagnosis not present

## 2017-10-03 DIAGNOSIS — I10 Essential (primary) hypertension: Secondary | ICD-10-CM

## 2017-10-03 DIAGNOSIS — H43813 Vitreous degeneration, bilateral: Secondary | ICD-10-CM | POA: Diagnosis not present

## 2017-11-14 ENCOUNTER — Encounter (INDEPENDENT_AMBULATORY_CARE_PROVIDER_SITE_OTHER): Payer: Medicare Other | Admitting: Ophthalmology

## 2017-11-14 DIAGNOSIS — H35033 Hypertensive retinopathy, bilateral: Secondary | ICD-10-CM

## 2017-11-14 DIAGNOSIS — H34812 Central retinal vein occlusion, left eye, with macular edema: Secondary | ICD-10-CM

## 2017-11-14 DIAGNOSIS — I10 Essential (primary) hypertension: Secondary | ICD-10-CM | POA: Diagnosis not present

## 2017-11-14 DIAGNOSIS — H43813 Vitreous degeneration, bilateral: Secondary | ICD-10-CM

## 2017-12-19 ENCOUNTER — Encounter (INDEPENDENT_AMBULATORY_CARE_PROVIDER_SITE_OTHER): Payer: Medicare Other | Admitting: Ophthalmology

## 2018-01-13 ENCOUNTER — Encounter (INDEPENDENT_AMBULATORY_CARE_PROVIDER_SITE_OTHER): Payer: Medicare Other | Admitting: Ophthalmology

## 2018-01-13 DIAGNOSIS — H35033 Hypertensive retinopathy, bilateral: Secondary | ICD-10-CM

## 2018-01-13 DIAGNOSIS — H43813 Vitreous degeneration, bilateral: Secondary | ICD-10-CM

## 2018-01-13 DIAGNOSIS — I10 Essential (primary) hypertension: Secondary | ICD-10-CM | POA: Diagnosis not present

## 2018-01-13 DIAGNOSIS — H34812 Central retinal vein occlusion, left eye, with macular edema: Secondary | ICD-10-CM

## 2018-02-17 ENCOUNTER — Encounter (INDEPENDENT_AMBULATORY_CARE_PROVIDER_SITE_OTHER): Payer: Medicare Other | Admitting: Ophthalmology

## 2018-02-17 DIAGNOSIS — I10 Essential (primary) hypertension: Secondary | ICD-10-CM

## 2018-02-17 DIAGNOSIS — H35033 Hypertensive retinopathy, bilateral: Secondary | ICD-10-CM

## 2018-02-17 DIAGNOSIS — H34812 Central retinal vein occlusion, left eye, with macular edema: Secondary | ICD-10-CM | POA: Diagnosis not present

## 2018-02-17 DIAGNOSIS — H43813 Vitreous degeneration, bilateral: Secondary | ICD-10-CM

## 2018-03-21 ENCOUNTER — Encounter (INDEPENDENT_AMBULATORY_CARE_PROVIDER_SITE_OTHER): Payer: Medicare Other | Admitting: Ophthalmology

## 2018-03-21 DIAGNOSIS — H35033 Hypertensive retinopathy, bilateral: Secondary | ICD-10-CM

## 2018-03-21 DIAGNOSIS — I10 Essential (primary) hypertension: Secondary | ICD-10-CM | POA: Diagnosis not present

## 2018-03-21 DIAGNOSIS — H43813 Vitreous degeneration, bilateral: Secondary | ICD-10-CM | POA: Diagnosis not present

## 2018-03-21 DIAGNOSIS — H26492 Other secondary cataract, left eye: Secondary | ICD-10-CM | POA: Diagnosis not present

## 2018-03-21 DIAGNOSIS — H34812 Central retinal vein occlusion, left eye, with macular edema: Secondary | ICD-10-CM | POA: Diagnosis not present

## 2018-03-21 DIAGNOSIS — H2511 Age-related nuclear cataract, right eye: Secondary | ICD-10-CM | POA: Diagnosis not present

## 2018-04-25 ENCOUNTER — Encounter (INDEPENDENT_AMBULATORY_CARE_PROVIDER_SITE_OTHER): Payer: Medicare Other | Admitting: Ophthalmology

## 2018-04-25 DIAGNOSIS — I10 Essential (primary) hypertension: Secondary | ICD-10-CM

## 2018-04-25 DIAGNOSIS — H35033 Hypertensive retinopathy, bilateral: Secondary | ICD-10-CM

## 2018-04-25 DIAGNOSIS — H43813 Vitreous degeneration, bilateral: Secondary | ICD-10-CM

## 2018-04-25 DIAGNOSIS — H2511 Age-related nuclear cataract, right eye: Secondary | ICD-10-CM

## 2018-04-25 DIAGNOSIS — H34812 Central retinal vein occlusion, left eye, with macular edema: Secondary | ICD-10-CM

## 2018-05-30 ENCOUNTER — Encounter (INDEPENDENT_AMBULATORY_CARE_PROVIDER_SITE_OTHER): Payer: Medicare Other | Admitting: Ophthalmology

## 2018-05-30 DIAGNOSIS — H34812 Central retinal vein occlusion, left eye, with macular edema: Secondary | ICD-10-CM

## 2018-05-30 DIAGNOSIS — H35033 Hypertensive retinopathy, bilateral: Secondary | ICD-10-CM

## 2018-05-30 DIAGNOSIS — H43813 Vitreous degeneration, bilateral: Secondary | ICD-10-CM | POA: Diagnosis not present

## 2018-05-30 DIAGNOSIS — I10 Essential (primary) hypertension: Secondary | ICD-10-CM | POA: Diagnosis not present

## 2018-07-04 ENCOUNTER — Encounter (INDEPENDENT_AMBULATORY_CARE_PROVIDER_SITE_OTHER): Payer: Medicare Other | Admitting: Ophthalmology

## 2018-07-07 ENCOUNTER — Encounter (INDEPENDENT_AMBULATORY_CARE_PROVIDER_SITE_OTHER): Payer: Medicare Other | Admitting: Ophthalmology

## 2018-07-07 DIAGNOSIS — I10 Essential (primary) hypertension: Secondary | ICD-10-CM | POA: Diagnosis not present

## 2018-07-07 DIAGNOSIS — H43813 Vitreous degeneration, bilateral: Secondary | ICD-10-CM

## 2018-07-07 DIAGNOSIS — H34812 Central retinal vein occlusion, left eye, with macular edema: Secondary | ICD-10-CM | POA: Diagnosis not present

## 2018-07-07 DIAGNOSIS — B309 Viral conjunctivitis, unspecified: Secondary | ICD-10-CM

## 2018-07-07 DIAGNOSIS — H35033 Hypertensive retinopathy, bilateral: Secondary | ICD-10-CM | POA: Diagnosis not present

## 2018-07-30 ENCOUNTER — Encounter (INDEPENDENT_AMBULATORY_CARE_PROVIDER_SITE_OTHER): Payer: Medicare Other | Admitting: Ophthalmology

## 2018-07-30 DIAGNOSIS — H34812 Central retinal vein occlusion, left eye, with macular edema: Secondary | ICD-10-CM | POA: Diagnosis not present

## 2018-07-30 DIAGNOSIS — H43813 Vitreous degeneration, bilateral: Secondary | ICD-10-CM | POA: Diagnosis not present

## 2018-07-30 DIAGNOSIS — H35033 Hypertensive retinopathy, bilateral: Secondary | ICD-10-CM

## 2018-07-30 DIAGNOSIS — I10 Essential (primary) hypertension: Secondary | ICD-10-CM

## 2018-08-04 ENCOUNTER — Encounter (INDEPENDENT_AMBULATORY_CARE_PROVIDER_SITE_OTHER): Payer: Medicare Other | Admitting: Ophthalmology

## 2018-08-25 ENCOUNTER — Encounter (INDEPENDENT_AMBULATORY_CARE_PROVIDER_SITE_OTHER): Payer: Medicare Other | Admitting: Ophthalmology

## 2018-08-25 DIAGNOSIS — I10 Essential (primary) hypertension: Secondary | ICD-10-CM | POA: Diagnosis not present

## 2018-08-25 DIAGNOSIS — H34812 Central retinal vein occlusion, left eye, with macular edema: Secondary | ICD-10-CM | POA: Diagnosis not present

## 2018-08-25 DIAGNOSIS — H35033 Hypertensive retinopathy, bilateral: Secondary | ICD-10-CM | POA: Diagnosis not present

## 2018-08-25 DIAGNOSIS — H43813 Vitreous degeneration, bilateral: Secondary | ICD-10-CM

## 2018-09-30 ENCOUNTER — Encounter (INDEPENDENT_AMBULATORY_CARE_PROVIDER_SITE_OTHER): Payer: Medicare Other | Admitting: Ophthalmology

## 2018-09-30 DIAGNOSIS — H34812 Central retinal vein occlusion, left eye, with macular edema: Secondary | ICD-10-CM | POA: Diagnosis not present

## 2018-09-30 DIAGNOSIS — H43813 Vitreous degeneration, bilateral: Secondary | ICD-10-CM

## 2018-09-30 DIAGNOSIS — I10 Essential (primary) hypertension: Secondary | ICD-10-CM

## 2018-09-30 DIAGNOSIS — H2511 Age-related nuclear cataract, right eye: Secondary | ICD-10-CM

## 2018-09-30 DIAGNOSIS — H35033 Hypertensive retinopathy, bilateral: Secondary | ICD-10-CM

## 2018-10-27 ENCOUNTER — Encounter (INDEPENDENT_AMBULATORY_CARE_PROVIDER_SITE_OTHER): Payer: Medicare Other | Admitting: Ophthalmology

## 2018-10-27 DIAGNOSIS — H34812 Central retinal vein occlusion, left eye, with macular edema: Secondary | ICD-10-CM

## 2018-10-27 DIAGNOSIS — H43813 Vitreous degeneration, bilateral: Secondary | ICD-10-CM | POA: Diagnosis not present

## 2018-10-27 DIAGNOSIS — H35033 Hypertensive retinopathy, bilateral: Secondary | ICD-10-CM

## 2018-10-27 DIAGNOSIS — I10 Essential (primary) hypertension: Secondary | ICD-10-CM

## 2018-11-26 ENCOUNTER — Encounter (INDEPENDENT_AMBULATORY_CARE_PROVIDER_SITE_OTHER): Payer: Medicare Other | Admitting: Ophthalmology

## 2018-11-26 ENCOUNTER — Other Ambulatory Visit: Payer: Self-pay

## 2018-11-26 DIAGNOSIS — H34812 Central retinal vein occlusion, left eye, with macular edema: Secondary | ICD-10-CM

## 2018-11-26 DIAGNOSIS — I10 Essential (primary) hypertension: Secondary | ICD-10-CM | POA: Diagnosis not present

## 2018-11-26 DIAGNOSIS — H35033 Hypertensive retinopathy, bilateral: Secondary | ICD-10-CM | POA: Diagnosis not present

## 2018-11-26 DIAGNOSIS — H2511 Age-related nuclear cataract, right eye: Secondary | ICD-10-CM

## 2018-11-26 DIAGNOSIS — H43813 Vitreous degeneration, bilateral: Secondary | ICD-10-CM | POA: Diagnosis not present

## 2018-12-24 ENCOUNTER — Encounter (INDEPENDENT_AMBULATORY_CARE_PROVIDER_SITE_OTHER): Payer: Medicare Other | Admitting: Ophthalmology

## 2018-12-24 ENCOUNTER — Other Ambulatory Visit: Payer: Self-pay

## 2018-12-24 DIAGNOSIS — H35033 Hypertensive retinopathy, bilateral: Secondary | ICD-10-CM

## 2018-12-24 DIAGNOSIS — H2513 Age-related nuclear cataract, bilateral: Secondary | ICD-10-CM

## 2018-12-24 DIAGNOSIS — I10 Essential (primary) hypertension: Secondary | ICD-10-CM | POA: Diagnosis not present

## 2018-12-24 DIAGNOSIS — H43813 Vitreous degeneration, bilateral: Secondary | ICD-10-CM

## 2018-12-24 DIAGNOSIS — H34812 Central retinal vein occlusion, left eye, with macular edema: Secondary | ICD-10-CM | POA: Diagnosis not present

## 2019-01-21 ENCOUNTER — Encounter (INDEPENDENT_AMBULATORY_CARE_PROVIDER_SITE_OTHER): Payer: Medicare Other | Admitting: Ophthalmology

## 2019-01-21 ENCOUNTER — Other Ambulatory Visit: Payer: Self-pay

## 2019-01-21 DIAGNOSIS — I10 Essential (primary) hypertension: Secondary | ICD-10-CM

## 2019-01-21 DIAGNOSIS — H34812 Central retinal vein occlusion, left eye, with macular edema: Secondary | ICD-10-CM

## 2019-01-21 DIAGNOSIS — H35033 Hypertensive retinopathy, bilateral: Secondary | ICD-10-CM | POA: Diagnosis not present

## 2019-01-21 DIAGNOSIS — H43813 Vitreous degeneration, bilateral: Secondary | ICD-10-CM | POA: Diagnosis not present

## 2019-02-26 ENCOUNTER — Other Ambulatory Visit: Payer: Self-pay

## 2019-02-26 ENCOUNTER — Encounter (INDEPENDENT_AMBULATORY_CARE_PROVIDER_SITE_OTHER): Payer: Medicare Other | Admitting: Ophthalmology

## 2019-02-26 DIAGNOSIS — I10 Essential (primary) hypertension: Secondary | ICD-10-CM

## 2019-02-26 DIAGNOSIS — H43813 Vitreous degeneration, bilateral: Secondary | ICD-10-CM

## 2019-02-26 DIAGNOSIS — H34812 Central retinal vein occlusion, left eye, with macular edema: Secondary | ICD-10-CM

## 2019-02-26 DIAGNOSIS — H35033 Hypertensive retinopathy, bilateral: Secondary | ICD-10-CM

## 2019-03-25 ENCOUNTER — Other Ambulatory Visit: Payer: Self-pay

## 2019-03-25 ENCOUNTER — Encounter (INDEPENDENT_AMBULATORY_CARE_PROVIDER_SITE_OTHER): Payer: Medicare Other | Admitting: Ophthalmology

## 2019-03-25 DIAGNOSIS — H34812 Central retinal vein occlusion, left eye, with macular edema: Secondary | ICD-10-CM | POA: Diagnosis not present

## 2019-03-25 DIAGNOSIS — I10 Essential (primary) hypertension: Secondary | ICD-10-CM | POA: Diagnosis not present

## 2019-03-25 DIAGNOSIS — H43813 Vitreous degeneration, bilateral: Secondary | ICD-10-CM | POA: Diagnosis not present

## 2019-03-25 DIAGNOSIS — H35033 Hypertensive retinopathy, bilateral: Secondary | ICD-10-CM

## 2019-04-23 ENCOUNTER — Other Ambulatory Visit: Payer: Self-pay

## 2019-04-23 ENCOUNTER — Encounter (INDEPENDENT_AMBULATORY_CARE_PROVIDER_SITE_OTHER): Payer: Medicare Other | Admitting: Ophthalmology

## 2019-04-23 DIAGNOSIS — H43813 Vitreous degeneration, bilateral: Secondary | ICD-10-CM | POA: Diagnosis not present

## 2019-04-23 DIAGNOSIS — H35033 Hypertensive retinopathy, bilateral: Secondary | ICD-10-CM | POA: Diagnosis not present

## 2019-04-23 DIAGNOSIS — H34812 Central retinal vein occlusion, left eye, with macular edema: Secondary | ICD-10-CM

## 2019-04-23 DIAGNOSIS — I10 Essential (primary) hypertension: Secondary | ICD-10-CM | POA: Diagnosis not present

## 2019-05-08 ENCOUNTER — Telehealth (INDEPENDENT_AMBULATORY_CARE_PROVIDER_SITE_OTHER): Payer: Self-pay

## 2019-05-21 ENCOUNTER — Encounter (INDEPENDENT_AMBULATORY_CARE_PROVIDER_SITE_OTHER): Payer: Medicare Other | Admitting: Ophthalmology

## 2019-05-21 ENCOUNTER — Other Ambulatory Visit: Payer: Self-pay

## 2019-05-21 DIAGNOSIS — H43813 Vitreous degeneration, bilateral: Secondary | ICD-10-CM | POA: Diagnosis not present

## 2019-05-21 DIAGNOSIS — H34812 Central retinal vein occlusion, left eye, with macular edema: Secondary | ICD-10-CM | POA: Diagnosis not present

## 2019-05-21 DIAGNOSIS — I10 Essential (primary) hypertension: Secondary | ICD-10-CM | POA: Diagnosis not present

## 2019-05-21 DIAGNOSIS — H35033 Hypertensive retinopathy, bilateral: Secondary | ICD-10-CM | POA: Diagnosis not present

## 2019-06-18 ENCOUNTER — Encounter (INDEPENDENT_AMBULATORY_CARE_PROVIDER_SITE_OTHER): Payer: Medicare Other | Admitting: Ophthalmology

## 2019-06-18 DIAGNOSIS — H34812 Central retinal vein occlusion, left eye, with macular edema: Secondary | ICD-10-CM | POA: Diagnosis not present

## 2019-06-18 DIAGNOSIS — I10 Essential (primary) hypertension: Secondary | ICD-10-CM

## 2019-06-18 DIAGNOSIS — H43813 Vitreous degeneration, bilateral: Secondary | ICD-10-CM | POA: Diagnosis not present

## 2019-06-18 DIAGNOSIS — H35033 Hypertensive retinopathy, bilateral: Secondary | ICD-10-CM

## 2019-07-16 ENCOUNTER — Encounter (INDEPENDENT_AMBULATORY_CARE_PROVIDER_SITE_OTHER): Payer: Medicare Other | Admitting: Ophthalmology

## 2019-07-22 ENCOUNTER — Other Ambulatory Visit: Payer: Self-pay

## 2019-07-22 ENCOUNTER — Encounter (INDEPENDENT_AMBULATORY_CARE_PROVIDER_SITE_OTHER): Payer: Medicare Other | Admitting: Ophthalmology

## 2019-07-22 DIAGNOSIS — H43813 Vitreous degeneration, bilateral: Secondary | ICD-10-CM

## 2019-07-22 DIAGNOSIS — H35033 Hypertensive retinopathy, bilateral: Secondary | ICD-10-CM

## 2019-07-22 DIAGNOSIS — I10 Essential (primary) hypertension: Secondary | ICD-10-CM

## 2019-07-22 DIAGNOSIS — H34812 Central retinal vein occlusion, left eye, with macular edema: Secondary | ICD-10-CM

## 2019-08-19 ENCOUNTER — Other Ambulatory Visit: Payer: Self-pay

## 2019-08-19 ENCOUNTER — Encounter (INDEPENDENT_AMBULATORY_CARE_PROVIDER_SITE_OTHER): Payer: Medicare Other | Admitting: Ophthalmology

## 2019-08-19 DIAGNOSIS — H34812 Central retinal vein occlusion, left eye, with macular edema: Secondary | ICD-10-CM

## 2019-08-19 DIAGNOSIS — H2511 Age-related nuclear cataract, right eye: Secondary | ICD-10-CM

## 2019-08-19 DIAGNOSIS — H43813 Vitreous degeneration, bilateral: Secondary | ICD-10-CM | POA: Diagnosis not present

## 2019-08-19 DIAGNOSIS — H35033 Hypertensive retinopathy, bilateral: Secondary | ICD-10-CM | POA: Diagnosis not present

## 2019-08-19 DIAGNOSIS — I10 Essential (primary) hypertension: Secondary | ICD-10-CM | POA: Diagnosis not present

## 2019-09-01 ENCOUNTER — Other Ambulatory Visit: Payer: Self-pay

## 2019-09-01 ENCOUNTER — Emergency Department (HOSPITAL_BASED_OUTPATIENT_CLINIC_OR_DEPARTMENT_OTHER)
Admission: EM | Admit: 2019-09-01 | Discharge: 2019-09-02 | Disposition: A | Discharge status: Home / Self Care | Payer: Medicare Other | Attending: Emergency Medicine | Admitting: Emergency Medicine

## 2019-09-01 ENCOUNTER — Encounter (HOSPITAL_BASED_OUTPATIENT_CLINIC_OR_DEPARTMENT_OTHER): Payer: Self-pay | Admitting: *Deleted

## 2019-09-01 ENCOUNTER — Emergency Department (HOSPITAL_BASED_OUTPATIENT_CLINIC_OR_DEPARTMENT_OTHER): Payer: Medicare Other

## 2019-09-01 DIAGNOSIS — R270 Ataxia, unspecified: Secondary | ICD-10-CM

## 2019-09-01 DIAGNOSIS — E119 Type 2 diabetes mellitus without complications: Secondary | ICD-10-CM | POA: Insufficient documentation

## 2019-09-01 DIAGNOSIS — Z79899 Other long term (current) drug therapy: Secondary | ICD-10-CM | POA: Diagnosis not present

## 2019-09-01 DIAGNOSIS — I10 Essential (primary) hypertension: Secondary | ICD-10-CM | POA: Insufficient documentation

## 2019-09-01 DIAGNOSIS — R42 Dizziness and giddiness: Secondary | ICD-10-CM | POA: Insufficient documentation

## 2019-09-01 LAB — BASIC METABOLIC PANEL
Anion gap: 11 (ref 5–15)
BUN: 16 mg/dL (ref 8–23)
CO2: 26 mmol/L (ref 22–32)
Calcium: 8.9 mg/dL (ref 8.9–10.3)
Chloride: 96 mmol/L — ABNORMAL LOW (ref 98–111)
Creatinine, Ser: 0.87 mg/dL (ref 0.44–1.00)
GFR calc Af Amer: >60 mL/min (ref >60)
GFR calc non Af Amer: >60 mL/min (ref >60)
Glucose, Bld: 202 mg/dL — ABNORMAL HIGH (ref 70–99)
Potassium: 3.5 mmol/L (ref 3.5–5.1)
Sodium: 133 mmol/L — ABNORMAL LOW (ref 135–145)

## 2019-09-01 LAB — CBC WITH DIFFERENTIAL/PLATELET
Abs Immature Granulocytes: 0.03 10*3/uL (ref 0.00–0.07)
Basophils Absolute: 0 10*3/uL (ref 0.0–0.1)
Basophils Relative: 0 %
Eosinophils Absolute: 0 10*3/uL (ref 0.0–0.5)
Eosinophils Relative: 0 %
HCT: 39.9 % (ref 36.0–46.0)
Hemoglobin: 13.8 g/dL (ref 12.0–15.0)
Immature Granulocytes: 0 %
Lymphocytes Relative: 13 %
Lymphs Abs: 1.1 10*3/uL (ref 0.7–4.0)
MCH: 27.3 pg (ref 26.0–34.0)
MCHC: 34.6 g/dL (ref 30.0–36.0)
MCV: 78.9 fL — ABNORMAL LOW (ref 80.0–100.0)
Monocytes Absolute: 0.4 10*3/uL (ref 0.1–1.0)
Monocytes Relative: 4 %
Neutro Abs: 6.9 10*3/uL (ref 1.7–7.7)
Neutrophils Relative %: 83 %
Platelets: 221 10*3/uL (ref 150–400)
RBC: 5.06 MIL/uL (ref 3.87–5.11)
RDW: 14 % (ref 11.5–15.5)
WBC: 8.4 10*3/uL (ref 4.0–10.5)
nRBC: 0 % (ref 0.0–0.2)

## 2019-09-01 LAB — URINALYSIS, ROUTINE W REFLEX MICROSCOPIC
Bilirubin Urine: NEGATIVE
Glucose, UA: 250 mg/dL — AB
Hgb urine dipstick: NEGATIVE
Ketones, ur: NEGATIVE mg/dL
Nitrite: POSITIVE — AB
Protein, ur: NEGATIVE mg/dL
Specific Gravity, Urine: 1.02 (ref 1.005–1.030)
pH: 8 (ref 5.0–8.0)

## 2019-09-01 LAB — URINALYSIS, MICROSCOPIC (REFLEX)

## 2019-09-01 MED ORDER — MECLIZINE HCL 25 MG PO TABS
12.5000 mg | ORAL_TABLET | Freq: Once | ORAL | Status: AC
  Administered 2019-09-02: 12.5 mg via ORAL
  Filled 2019-09-01: qty 1

## 2019-09-01 MED ORDER — ONDANSETRON HCL 4 MG/2ML IJ SOLN
4.0000 mg | Freq: Once | INTRAMUSCULAR | Status: AC
  Administered 2019-09-02: 4 mg via INTRAVENOUS
  Filled 2019-09-01: qty 2

## 2019-09-01 MED ORDER — SODIUM CHLORIDE 0.9 % IV BOLUS
1000.0000 mL | Freq: Once | INTRAVENOUS | Status: AC
  Administered 2019-09-02: 1000 mL via INTRAVENOUS

## 2019-09-01 NOTE — ED Provider Notes (Addendum)
[facility] EMERGENCY DEPARTMENT Provider Note  CSN: [id number] Arrival date & time: 09/01/19 1930  Chief Complaint(s) Dizziness  HPI Kimberly Simmons is a 78 y.o. female   The history is provided by the patient.  Dizziness Quality:  Lightheadedness and room spinning Severity:  Moderate Onset quality:  Gradual Timing:  Constant Progression:  Resolved Chronicity:  New Context comment:  Started after emesis Relieved by:  Food Worsened by:  Lying down Associated symptoms: nausea and vomiting   Associated symptoms: no blood in stool, no chest pain, no diarrhea, no headaches, no palpitations, no shortness of breath, no syncope, no tinnitus and no vision changes   Associated symptoms comment:  Ataxia   Past Medical History Past Medical History:  Diagnosis Date  . Diabetes mellitus without complication (HCC)   . Hypertension    There are no problems to display for this patient.  Home Medication(s) Prior to Admission medications   Medication Sig Start Date End Date Taking? Authorizing Provider  amLODipine (NORVASC) 10 MG tablet Take 1 tablet (10 mg total) by mouth daily. 12/10/14  Yes Rodolph Bong, MD  Azilsartan-Chlorthalidone (EDARBYCLOR) 40-25 MG TABS Take 1 tablet by mouth daily. 12/10/14  Yes Rodolph Bong, MD  BYSTOLIC 10 MG tablet  03/27/19  Yes [provider]  Cholecalciferol (VITAMIN D PO) Take by mouth.   Yes [provider]  cloNIDine (CATAPRES) 0.1 MG tablet Take 0.1 mg by mouth at bedtime.   Yes [provider]  TRADJENTA 5 MG TABS tablet  06/05/19  Yes [provider]  fenofibrate (TRICOR) 145 MG tablet Take 145 mg by mouth daily.    [provider]  fish oil-omega-3 fatty acids 1000 MG capsule Take 2 g by mouth daily.    [provider]  meloxicam (MOBIC) 7.5 MG tablet Take 7.5 mg by mouth daily.    [provider]  METFORMIN HCL PO Take by mouth.    [provider]  Pregabalin (LYRICA  PO) Take by mouth.    [provider]  UNKNOWN TO PATIENT Bone health supplement    [provider]                                                                                                                                    Past Surgical History History reviewed. No pertinent surgical history. Family History No family history on file.  Social History Social History   Tobacco Use  . Smoking status: Never Smoker  . Smokeless tobacco: Never Used  Substance Use Topics  . Alcohol use: No  . Drug use: No   Allergies Patient has no known allergies.  Review of Systems Review of Systems  HENT: Negative for tinnitus.   Respiratory: Negative for shortness of breath.   Cardiovascular: Negative for chest pain, palpitations and syncope.  Gastrointestinal: Positive for nausea and vomiting. Negative for blood in stool and diarrhea.  Neurological: Positive for dizziness. Negative for headaches.   All other systems are reviewed and are negative for acute change except as noted in the HPI  Physical Exam Vital Signs  I have reviewed the triage vital signs BP (!) 169/72 (BP Location: Left Arm)   Pulse 60   Temp 98.3 F (36.8 C) (Oral)   Resp 16   SpO2 99%   Physical Exam Vitals reviewed.  Constitutional:      General: She is not in acute distress.    Appearance: She is well-developed. She is not diaphoretic.  HENT:     Head: Normocephalic and atraumatic.     Nose: Nose normal.  Eyes:     General: No scleral icterus.       Right eye: No discharge.        Left eye: No discharge.     Conjunctiva/sclera: Conjunctivae normal.     Pupils: Pupils are equal, round, and reactive to light.  Cardiovascular:     Rate and Rhythm: Normal rate and regular rhythm.     Heart sounds: No murmur. No friction rub. No gallop.   Pulmonary:     Effort: Pulmonary effort is normal. No respiratory distress.     Breath sounds: Normal breath sounds. No stridor. No rales.    Abdominal:     General: There is no distension.     Palpations: Abdomen is soft.     Tenderness: There is no abdominal tenderness.  Musculoskeletal:        General: No tenderness.     Cervical back: Normal range of motion and neck supple.  Skin:    General: Skin is warm and dry.     Findings: No erythema or rash.  Neurological:     Mental Status: She is alert and oriented to person, place, and time.     Comments: Mental Status:  Alert and oriented to person, place, and time.  Attention and concentration normal.  Speech clear.  Recent memory is intact  Cranial Nerves:  II Visual Fields: Intact to confrontation. Visual fields intact. III, IV, VI: Pupils equal and reactive to light and near. Full eye movement without nystagmus  V Facial Sensation: Normal. No weakness of masticatory muscles  VII: No facial weakness or asymmetry  VIII Auditory Acuity: Grossly normal  IX/X: The uvula is midline; the palate elevates symmetrically  XI: Normal sternocleidomastoid and trapezius strength  XII: The tongue is midline. No atrophy or fasciculations.   Motor System: Muscle Strength: 5/5 and symmetric in the upper and lower extremities. No pronation or drift.  Muscle Tone: Tone and muscle bulk are normal in the upper and lower extremities.   Reflexes: DTRs: 1+ and symmetrical in all four extremities. No Clonus Coordination: mild dysmetria with FTN on left  Sensation: Intact to light touch.  Gait: ataxic      ED Results and Treatments Labs (all labs ordered are listed, but only abnormal results are displayed) Labs Reviewed  CBC WITH DIFFERENTIAL/PLATELET - Abnormal; Notable for the following components:      Result Value   MCV 78.9 (*)    All other components within normal limits  BASIC METABOLIC PANEL - Abnormal; Notable for the following components:   Sodium 133 (*)    Chloride 96 (*)    Glucose, Bld 202 (*)    All other components within normal limits  URINALYSIS, ROUTINE W  REFLEX MICROSCOPIC - Abnormal; Notable for the following components:   Glucose, UA 250 (*)  Nitrite POSITIVE (*)    Leukocytes,Ua TRACE (*)    All other components within normal limits  URINALYSIS, MICROSCOPIC (REFLEX) - Abnormal; Notable for the following components:   Bacteria, UA MANY (*)    All other components within normal limits                                                                                                                         EKG  EKG Interpretation  Date/Time:    Ventricular Rate:    PR Interval:    QRS Duration:   QT Interval:    QTC Calculation:   R Axis:     Text Interpretation:        Radiology CT Head Wo Contrast  Result Date: 09/02/2019 CLINICAL DATA:  Initial evaluation for acute dizziness, ataxia. EXAM: CT HEAD WITHOUT CONTRAST TECHNIQUE: Contiguous axial images were obtained from the base of the skull through the vertex without intravenous contrast. COMPARISON:  None. FINDINGS: Brain: Mild age-related cerebral atrophy. Patchy and confluent hypodensity involving the periventricular deep white matter both cerebral hemispheres most likely related chronic microvascular ischemic disease, moderate in nature. No acute intracranial hemorrhage. No acute large vessel territory infarct. No mass lesion, midline shift or mass effect. No hydrocephalus. No extra-axial fluid collection. Vascular: No hyperdense vessel. Scattered vascular calcifications noted within the carotid siphons. Skull: Scalp soft tissues and calvarium within normal limits. Sinuses/Orbits: Globes and orbital soft tissues demonstrate no acute finding. Patient status post ocular lens replacement on the left. Paranasal sinuses and mastoid air cells are clear. Other: None. IMPRESSION: 1. No acute intracranial abnormality. 2. Moderate chronic microvascular ischemic disease. Electronically Signed   By: Rise MuBenjamin  McClintock M.D.   On: 09/02/2019 00:24    Pertinent labs & imaging results that were  available during my care of the patient were reviewed by me and considered in my medical decision making (see chart for details).  Medications Ordered in ED Medications  sodium chloride 0.9 % bolus 1,000 mL (0 mLs Intravenous Stopped 09/02/19 0228)  ondansetron (ZOFRAN) injection 4 mg (4 mg Intravenous Given 09/02/19 0014)  meclizine (ANTIVERT) tablet 12.5 mg (12.5 mg Oral Given 09/02/19 0000)  cefTRIAXone (ROCEPHIN) 1 g in sodium chloride 0.9 % 100 mL IVPB (0 g Intravenous Stopped 09/02/19 0222)  Procedures Procedures  (including critical care time)  Medical Decision Making / ED Course I have reviewed the nursing notes for this encounter and the patient's prior records (if available in EHR or on provided paperwork).   Kimberly Simmons was evaluated in Emergency Department on 09/02/2019 for the symptoms described in the history of present illness. She was evaluated in the context of the global COVID-19 pandemic, which necessitated consideration that the patient might be at risk for infection with the SARS-CoV-2 virus that causes COVID-19. Institutional protocols and algorithms that pertain to the evaluation of patients at risk for COVID-19 are in a state of rapid change based on information released by regulatory bodies including the CDC and federal and state organizations. These policies and algorithms were followed during the patient's care in the ED.  Patient presents with headache and lightheadedness.  Work-up in triage was notable for urinary tract infection.  Otherwise no significant electrolyte derangements or renal insufficiency.   She has physical exam findings concerning for possible posterior CVA.  CT head was negative.  Patient has an ABCD 2 score of 5.   After IV fluids, antiemetics and Antivert, patient was reassessed and still ataxic.  Feel MRI is  warranted to rule out posterior stroke.  We will transfer to [facility] for MRI.  If positive, patient will likely require admission for stroke work-up and management.   Spoke with Dr. Otelia Limes, Neurology, who recommended CTA head and neck while awaiting transfer.   Please call Neurology once MRI has been completed for evaluation.  Patient accepted to [facility] ED by Dr. Blinda Leatherwood.   Transport here prior to getting CTA.  Final Clinical Impression(s) / ED Diagnoses Final diagnoses:  Ataxia  Dizziness      This chart was dictated using voice recognition software.  Despite best efforts to proofread,  errors can occur which can change the documentation meaning.     Nira Conn, MD 09/02/19 [phone]

## 2019-09-01 NOTE — ED Notes (Signed)
ED Provider at bedside. 

## 2019-09-01 NOTE — ED Notes (Signed)
Patient transported to CT 

## 2019-09-01 NOTE — ED Triage Notes (Signed)
Dizziness since 3pm. Lightheaded. Nausea. No hx of strokes.

## 2019-09-02 ENCOUNTER — Emergency Department (HOSPITAL_COMMUNITY): Payer: Medicare Other

## 2019-09-02 DIAGNOSIS — R42 Dizziness and giddiness: Secondary | ICD-10-CM | POA: Insufficient documentation

## 2019-09-02 LAB — CBG MONITORING, ED: Glucose-Capillary: 153 mg/dL — ABNORMAL HIGH (ref 70–99)

## 2019-09-02 MED ORDER — MECLIZINE HCL 12.5 MG PO TABS: 12.5000 mg | ORAL_TABLET | Freq: Three times a day (TID) | ORAL | 0 refills | Status: AC | PRN

## 2019-09-02 MED ORDER — IOHEXOL 350 MG/ML SOLN
80.0000 mL | Freq: Once | INTRAVENOUS | Status: AC | PRN
  Administered 2019-09-02: 80 mL via INTRAVENOUS

## 2019-09-02 MED ORDER — CEPHALEXIN 250 MG PO CAPS
500.0000 mg | ORAL_CAPSULE | Freq: Once | ORAL | Status: AC
  Administered 2019-09-02: 16:00:00 500 mg via ORAL
  Filled 2019-09-02: qty 2

## 2019-09-02 MED ORDER — SODIUM CHLORIDE 0.9 % IV SOLN
1.0000 g | Freq: Once | INTRAVENOUS | Status: AC
  Administered 2019-09-02: 02:00:00 1 g via INTRAVENOUS
  Filled 2019-09-02: qty 10

## 2019-09-02 MED ORDER — CEPHALEXIN 500 MG PO CAPS: 500.0000 mg | ORAL_CAPSULE | Freq: Two times a day (BID) | ORAL | 0 refills | Status: AC

## 2019-09-02 NOTE — ED Notes (Signed)
ORDERED DIET TRAY  

## 2019-09-02 NOTE — ED Notes (Signed)
Ambulated to restroom with minimal assist  Pt gait remains slightly unsteady

## 2019-09-02 NOTE — ED Notes (Signed)
Patient transported to MRI 

## 2019-09-02 NOTE — ED Notes (Signed)
Transferred to [facility] via [employer]

## 2019-09-02 NOTE — ED Provider Notes (Signed)
Patient received in transfer from [facility] with symptoms of dizziness and ataxia that started yesterday, associated with nausea, vomiting, here for MRI, CTA head and neck to evaluate for stroke. She received Meclizine, IVF's and antiemetics without improvement.   On arrival she reports feeling much better than when seen earlier. No dizziness or nausea. She denies headache. She ambulates without assistance - seems guarded when walking but not ataxic and reports significant improvement. VSS.   Awaiting studies as discussed with neurology prior to transfer.   CTA's without acute finding. Awaiting MRI.   6:30 - patient care signed out to Kimberly Kaufmann, PA-C, pending MRI and discussion with neurology as planned prior to transfer.    Charlann Lange, PA-C 09/02/19 4268    Ripley Fraise, MD 09/02/19 [phone]

## 2019-09-02 NOTE — ED Notes (Signed)
Report called to Gretta Cool, Therapist, sports charge at [employer]

## 2019-09-02 NOTE — ED Provider Notes (Signed)
Kimberly Simmons is a 78 y.o. female, with a history of DM and HTN, presenting to the ED with dizziness.   Original HPI from Drema Pry, MD: "Kimberly Simmons is a 78 y.o. female  The history is provided by the patient.  Dizziness  Quality: Lightheadedness and room spinning  Severity: Moderate  Onset quality: Gradual  Timing: Constant  Progression: Resolved  Chronicity: New  Context comment: Started after emesis  Relieved by: Food  Worsened by: Lying down Associated symptoms: nausea and vomiting  Associated symptoms: no blood in stool, no chest pain, no diarrhea, no headaches, no palpitations, no shortness of breath, no syncope, no tinnitus and no vision changes  Associated symptoms comment: Ataxia"  Past Medical History:  Diagnosis Date  . Diabetes mellitus without complication (HCC)   . Hypertension     Physical Exam  BP (!) 150/62 (BP Location: Right Arm)   Pulse 62   Temp 98.2 F (36.8 C) (Oral)   Resp 16   SpO2 97%   Physical Exam Vitals and nursing note reviewed.  Constitutional:      General: She is not in acute distress.    Appearance: She is well-developed. She is not diaphoretic.  HENT:     Head: Normocephalic and atraumatic.     Mouth/Throat:     Mouth: Mucous membranes are moist.     Pharynx: Oropharynx is clear.  Eyes:     Conjunctiva/sclera: Conjunctivae normal.  Cardiovascular:     Rate and Rhythm: Normal rate and regular rhythm.     Pulses: Normal pulses.          Radial pulses are 2+ on the right side and 2+ on the left side.       Posterior tibial pulses are 2+ on the right side and 2+ on the left side.     Heart sounds: Normal heart sounds.     Comments: Tactile temperature in the extremities appropriate and equal bilaterally. Pulmonary:     Effort: Pulmonary effort is normal. No respiratory distress.     Breath sounds: Normal breath sounds.  Abdominal:     Palpations: Abdomen is soft.     Tenderness: There is no abdominal tenderness. There  is no guarding.  Musculoskeletal:     Cervical back: Neck supple.     Right lower leg: No edema.     Left lower leg: No edema.  Skin:    General: Skin is warm and dry.  Neurological:     Mental Status: She is alert.     Comments: Sensation grossly intact to light touch in the extremities.  Grip strengths equal bilaterally.  Strength 5/5 in all extremities. No gait disturbance. Coordination intact. Cranial nerves III-XII grossly intact. No facial droop.   Psychiatric:        Mood and Affect: Mood and affect normal.        Speech: Speech normal.        Behavior: Behavior normal.     ED Course/Procedures    Procedures   Abnormal Labs Reviewed  CBC WITH DIFFERENTIAL/PLATELET - Abnormal; Notable for the following components:      Result Value   MCV 78.9 (*)    All other components within normal limits  BASIC METABOLIC PANEL - Abnormal; Notable for the following components:   Sodium 133 (*)    Chloride 96 (*)    Glucose, Bld 202 (*)    All other components within normal limits  URINALYSIS, ROUTINE W REFLEX MICROSCOPIC -  Abnormal; Notable for the following components:   Glucose, UA 250 (*)    Nitrite POSITIVE (*)    Leukocytes,Ua TRACE (*)    All other components within normal limits  URINALYSIS, MICROSCOPIC (REFLEX) - Abnormal; Notable for the following components:   Bacteria, UA MANY (*)    All other components within normal limits  CBG MONITORING, ED - Abnormal; Notable for the following components:   Glucose-Capillary 153 (*)    All other components within normal limits    CT Angio Head W or Wo Contrast  Result Date: 09/02/2019 CLINICAL DATA:  Dizziness since yesterday. Ataxia with stroke suspected EXAM: CT ANGIOGRAPHY HEAD AND NECK TECHNIQUE: Multidetector CT imaging of the head and neck was performed using the standard protocol during bolus administration of intravenous contrast. Multiplanar CT image reconstructions and MIPs were obtained to evaluate the vascular  anatomy. Carotid stenosis measurements (when applicable) are obtained utilizing NASCET criteria, using the distal internal carotid diameter as the denominator. CONTRAST:  80mL OMNIPAQUE IOHEXOL 350 MG/ML SOLN COMPARISON:  Head CT from yesterday FINDINGS: CTA NECK FINDINGS Aortic arch: Unremarkable. Right carotid system: Mild for age atherosclerotic plaque along the posterior wall of the ICA bulb. No stenosis or ulceration. Left carotid system: Mild atheromatous change at the ICA bulb. No stenosis or ulceration. Vertebral arteries: No proximal subclavian stenosis. Bilateral vertebral tortuosity. No narrowing or beading. Skeleton: Cervical spine degeneration. No acute or aggressive osseous finding. Other neck: 13 mm left thyroid nodule without invasive feature or neck adenopathy. This does not meet size threshold for sonographic follow-up per consensus recommendations. Upper chest: Negative Review of the MIP images confirms the above findings CTA HEAD FINDINGS Anterior circulation: Mild atherosclerotic plaque at the carotid siphons. No branch occlusion, beading, or proximal flow limiting stenosis. Posterior circulation: Vertebral and basilar arteries are smooth and widely patent. No stenosis or branch occlusion. Negative for aneurysm. Venous sinuses: Diffusely patent Anatomic variants: None significant Review of the MIP images confirms the above findings IMPRESSION: Unremarkable CTA for age.  No explanation for dizziness. Electronically Signed   By: Marnee SpringJonathon  Watts M.D.   On: 09/02/2019 05:33   CT Head Wo Contrast  Result Date: 09/02/2019 CLINICAL DATA:  Initial evaluation for acute dizziness, ataxia. EXAM: CT HEAD WITHOUT CONTRAST TECHNIQUE: Contiguous axial images were obtained from the base of the skull through the vertex without intravenous contrast. COMPARISON:  None. FINDINGS: Brain: Mild age-related cerebral atrophy. Patchy and confluent hypodensity involving the periventricular deep white matter both  cerebral hemispheres most likely related chronic microvascular ischemic disease, moderate in nature. No acute intracranial hemorrhage. No acute large vessel territory infarct. No mass lesion, midline shift or mass effect. No hydrocephalus. No extra-axial fluid collection. Vascular: No hyperdense vessel. Scattered vascular calcifications noted within the carotid siphons. Skull: Scalp soft tissues and calvarium within normal limits. Sinuses/Orbits: Globes and orbital soft tissues demonstrate no acute finding. Patient status post ocular lens replacement on the left. Paranasal sinuses and mastoid air cells are clear. Other: None. IMPRESSION: 1. No acute intracranial abnormality. 2. Moderate chronic microvascular ischemic disease. Electronically Signed   By: Rise MuBenjamin  McClintock M.D.   On: 09/02/2019 00:24   CT Angio Neck W and/or Wo Contrast  Result Date: 09/02/2019 CLINICAL DATA:  Dizziness since yesterday. Ataxia with stroke suspected EXAM: CT ANGIOGRAPHY HEAD AND NECK TECHNIQUE: Multidetector CT imaging of the head and neck was performed using the standard protocol during bolus administration of intravenous contrast. Multiplanar CT image reconstructions and MIPs were obtained to evaluate the  vascular anatomy. Carotid stenosis measurements (when applicable) are obtained utilizing NASCET criteria, using the distal internal carotid diameter as the denominator. CONTRAST:  63mL OMNIPAQUE IOHEXOL 350 MG/ML SOLN COMPARISON:  Head CT from yesterday FINDINGS: CTA NECK FINDINGS Aortic arch: Unremarkable. Right carotid system: Mild for age atherosclerotic plaque along the posterior wall of the ICA bulb. No stenosis or ulceration. Left carotid system: Mild atheromatous change at the ICA bulb. No stenosis or ulceration. Vertebral arteries: No proximal subclavian stenosis. Bilateral vertebral tortuosity. No narrowing or beading. Skeleton: Cervical spine degeneration. No acute or aggressive osseous finding. Other neck: 13 mm  left thyroid nodule without invasive feature or neck adenopathy. This does not meet size threshold for sonographic follow-up per consensus recommendations. Upper chest: Negative Review of the MIP images confirms the above findings CTA HEAD FINDINGS Anterior circulation: Mild atherosclerotic plaque at the carotid siphons. No branch occlusion, beading, or proximal flow limiting stenosis. Posterior circulation: Vertebral and basilar arteries are smooth and widely patent. No stenosis or branch occlusion. Negative for aneurysm. Venous sinuses: Diffusely patent Anatomic variants: None significant Review of the MIP images confirms the above findings IMPRESSION: Unremarkable CTA for age.  No explanation for dizziness. Electronically Signed   By: Monte Fantasia M.D.   On: 09/02/2019 05:33    MDM   Clinical Course as of Sep 02 1639  Wed Sep 02, 2019  0705 Patient states she feels much better.  She ambulated independently to the bathroom without onset of symptoms.   [SJ]  2505 [id number] to follow up with MRI. They state, "We will get to her when we can."   [SJ]  1229 Patient gone to MRI.   [SJ]    Clinical Course User Index [SJ] Lorayne Bender, PA-C   Patient care handoff report received from Charlann Lange, PA-C. Plan: Patient awaiting MRI and then may be discharged.  Patient presents with dizziness and ataxia. Patient is nontoxic appearing, afebrile, not tachycardic, not tachypneic, not hypotensive, maintains excellent SPO2 on room air, and is in no apparent distress.  Upon my assumption of care, patient had no complaints.  She remained complaint free throughout my time caring for her. She was allowed to eat and continue to hydrate during the rest of her ED course.  Ambulated multiple times without assistance during her ED course. MRI without acute abnormality. UA with evidence of UTI. The patient was given instructions for home care as well as return precautions. Patient voices understanding of these  instructions, accepts the plan, and is comfortable with discharge.  Vitals:   09/01/19 2256 09/02/19 0157 09/02/19 0422 09/02/19 0625  BP: (!) 169/72 (!) 173/75 (!) 168/64 (!) 150/62  Pulse: 60 69 60 62  Resp: 16 16 16 16   Temp: 98.3 F (36.8 C) 98.3 F (36.8 C) 98.2 F (36.8 C)   TempSrc: Oral Oral Oral   SpO2: 99% 99% 98% 97%   Orthostatic VS for the past 24 hrs:  BP- Lying Pulse- Lying BP- Sitting Pulse- Sitting BP- Standing at 0 minutes Pulse- Standing at 0 minutes  09/01/19 2350 159/61 57 157/67 70 147/69 [address] PA-C 09/02/19 1655    Drenda Freeze, MD 09/03/19 [phone]

## 2019-09-02 NOTE — ED Notes (Signed)
ED Provider at bedside. 

## 2019-09-02 NOTE — Discharge Instructions (Addendum)
You were seen in the ED today due to dizziness.  The work-up was overall reassuring.  The MRI was without acute abnormality. You may follow-up with a primary care provider and/or neurology for any further assessment and management of dizziness symptoms. Meclizine: Meclizine is a medication that can be used to treat dizziness.  You also had evidence of possible UTI on the urinalysis. Please take all of your antibiotics until finished!   You may develop abdominal discomfort or diarrhea from the antibiotic.  You may help offset this with probiotics which you can buy or get in yogurt. Do not eat or take the probiotics until 2 hours after your antibiotic.

## 2019-09-03 LAB — URINE CULTURE: Culture: <10000 — AB

## 2019-09-16 ENCOUNTER — Encounter (INDEPENDENT_AMBULATORY_CARE_PROVIDER_SITE_OTHER): Payer: Medicare Other | Admitting: Ophthalmology

## 2019-09-16 ENCOUNTER — Other Ambulatory Visit: Payer: Self-pay

## 2019-09-16 DIAGNOSIS — I10 Essential (primary) hypertension: Secondary | ICD-10-CM | POA: Diagnosis not present

## 2019-09-16 DIAGNOSIS — H35033 Hypertensive retinopathy, bilateral: Secondary | ICD-10-CM | POA: Diagnosis not present

## 2019-09-16 DIAGNOSIS — H43813 Vitreous degeneration, bilateral: Secondary | ICD-10-CM

## 2019-09-16 DIAGNOSIS — H34812 Central retinal vein occlusion, left eye, with macular edema: Secondary | ICD-10-CM

## 2019-09-16 DIAGNOSIS — H35371 Puckering of macula, right eye: Secondary | ICD-10-CM | POA: Diagnosis not present

## 2019-09-16 DIAGNOSIS — H2511 Age-related nuclear cataract, right eye: Secondary | ICD-10-CM

## 2019-10-14 ENCOUNTER — Other Ambulatory Visit: Payer: Self-pay

## 2019-10-14 ENCOUNTER — Encounter (INDEPENDENT_AMBULATORY_CARE_PROVIDER_SITE_OTHER): Payer: Medicare Other | Admitting: Ophthalmology

## 2019-10-14 DIAGNOSIS — H34812 Central retinal vein occlusion, left eye, with macular edema: Secondary | ICD-10-CM | POA: Diagnosis not present

## 2019-10-14 DIAGNOSIS — H35033 Hypertensive retinopathy, bilateral: Secondary | ICD-10-CM

## 2019-10-14 DIAGNOSIS — H43813 Vitreous degeneration, bilateral: Secondary | ICD-10-CM

## 2019-10-14 DIAGNOSIS — I10 Essential (primary) hypertension: Secondary | ICD-10-CM

## 2019-11-02 DIAGNOSIS — R21 Rash and other nonspecific skin eruption: Secondary | ICD-10-CM | POA: Insufficient documentation

## 2019-11-11 ENCOUNTER — Encounter (INDEPENDENT_AMBULATORY_CARE_PROVIDER_SITE_OTHER): Payer: Medicare Other | Admitting: Ophthalmology

## 2019-11-11 DIAGNOSIS — H43813 Vitreous degeneration, bilateral: Secondary | ICD-10-CM | POA: Diagnosis not present

## 2019-11-11 DIAGNOSIS — H34812 Central retinal vein occlusion, left eye, with macular edema: Secondary | ICD-10-CM | POA: Diagnosis not present

## 2019-11-11 DIAGNOSIS — H35033 Hypertensive retinopathy, bilateral: Secondary | ICD-10-CM

## 2019-11-11 DIAGNOSIS — I10 Essential (primary) hypertension: Secondary | ICD-10-CM | POA: Diagnosis not present

## 2019-12-09 ENCOUNTER — Other Ambulatory Visit: Payer: Self-pay

## 2019-12-09 ENCOUNTER — Encounter (INDEPENDENT_AMBULATORY_CARE_PROVIDER_SITE_OTHER): Payer: Medicare Other | Admitting: Ophthalmology

## 2019-12-09 DIAGNOSIS — H35033 Hypertensive retinopathy, bilateral: Secondary | ICD-10-CM

## 2019-12-09 DIAGNOSIS — I10 Essential (primary) hypertension: Secondary | ICD-10-CM | POA: Diagnosis not present

## 2019-12-09 DIAGNOSIS — H43813 Vitreous degeneration, bilateral: Secondary | ICD-10-CM

## 2019-12-09 DIAGNOSIS — H34812 Central retinal vein occlusion, left eye, with macular edema: Secondary | ICD-10-CM | POA: Diagnosis not present

## 2020-01-06 ENCOUNTER — Encounter (INDEPENDENT_AMBULATORY_CARE_PROVIDER_SITE_OTHER): Payer: Medicare Other | Admitting: Ophthalmology

## 2020-01-06 DIAGNOSIS — I10 Essential (primary) hypertension: Secondary | ICD-10-CM | POA: Diagnosis not present

## 2020-01-06 DIAGNOSIS — H43813 Vitreous degeneration, bilateral: Secondary | ICD-10-CM | POA: Diagnosis not present

## 2020-01-06 DIAGNOSIS — H35033 Hypertensive retinopathy, bilateral: Secondary | ICD-10-CM | POA: Diagnosis not present

## 2020-01-06 DIAGNOSIS — H34812 Central retinal vein occlusion, left eye, with macular edema: Secondary | ICD-10-CM | POA: Diagnosis not present

## 2020-02-05 ENCOUNTER — Other Ambulatory Visit: Payer: Self-pay

## 2020-02-05 ENCOUNTER — Encounter (INDEPENDENT_AMBULATORY_CARE_PROVIDER_SITE_OTHER): Payer: Medicare Other | Admitting: Ophthalmology

## 2020-02-05 DIAGNOSIS — I10 Essential (primary) hypertension: Secondary | ICD-10-CM | POA: Diagnosis not present

## 2020-02-05 DIAGNOSIS — H35033 Hypertensive retinopathy, bilateral: Secondary | ICD-10-CM | POA: Diagnosis not present

## 2020-02-05 DIAGNOSIS — H43813 Vitreous degeneration, bilateral: Secondary | ICD-10-CM | POA: Diagnosis not present

## 2020-02-05 DIAGNOSIS — H34812 Central retinal vein occlusion, left eye, with macular edema: Secondary | ICD-10-CM | POA: Diagnosis not present

## 2020-03-04 ENCOUNTER — Other Ambulatory Visit: Payer: Self-pay

## 2020-03-04 ENCOUNTER — Encounter (INDEPENDENT_AMBULATORY_CARE_PROVIDER_SITE_OTHER): Payer: Medicare Other | Admitting: Ophthalmology

## 2020-03-04 DIAGNOSIS — H35033 Hypertensive retinopathy, bilateral: Secondary | ICD-10-CM

## 2020-03-04 DIAGNOSIS — H34832 Tributary (branch) retinal vein occlusion, left eye, with macular edema: Secondary | ICD-10-CM

## 2020-03-04 DIAGNOSIS — H43813 Vitreous degeneration, bilateral: Secondary | ICD-10-CM

## 2020-03-04 DIAGNOSIS — I10 Essential (primary) hypertension: Secondary | ICD-10-CM | POA: Diagnosis not present

## 2020-04-01 ENCOUNTER — Encounter (INDEPENDENT_AMBULATORY_CARE_PROVIDER_SITE_OTHER): Payer: Medicare Other | Admitting: Ophthalmology

## 2020-04-01 ENCOUNTER — Other Ambulatory Visit: Payer: Self-pay

## 2020-04-01 DIAGNOSIS — I10 Essential (primary) hypertension: Secondary | ICD-10-CM | POA: Diagnosis not present

## 2020-04-01 DIAGNOSIS — H35033 Hypertensive retinopathy, bilateral: Secondary | ICD-10-CM

## 2020-04-01 DIAGNOSIS — H43813 Vitreous degeneration, bilateral: Secondary | ICD-10-CM | POA: Diagnosis not present

## 2020-04-01 DIAGNOSIS — H34812 Central retinal vein occlusion, left eye, with macular edema: Secondary | ICD-10-CM

## 2020-04-29 ENCOUNTER — Encounter (INDEPENDENT_AMBULATORY_CARE_PROVIDER_SITE_OTHER): Payer: Medicare Other | Admitting: Ophthalmology

## 2020-04-29 ENCOUNTER — Other Ambulatory Visit: Payer: Self-pay

## 2020-04-29 DIAGNOSIS — I10 Essential (primary) hypertension: Secondary | ICD-10-CM | POA: Diagnosis not present

## 2020-04-29 DIAGNOSIS — H34812 Central retinal vein occlusion, left eye, with macular edema: Secondary | ICD-10-CM

## 2020-04-29 DIAGNOSIS — H35033 Hypertensive retinopathy, bilateral: Secondary | ICD-10-CM | POA: Diagnosis not present

## 2020-04-29 DIAGNOSIS — H43813 Vitreous degeneration, bilateral: Secondary | ICD-10-CM | POA: Diagnosis not present

## 2020-05-27 ENCOUNTER — Encounter (INDEPENDENT_AMBULATORY_CARE_PROVIDER_SITE_OTHER): Payer: Medicare Other | Admitting: Ophthalmology

## 2020-05-27 ENCOUNTER — Other Ambulatory Visit: Payer: Self-pay

## 2020-05-27 DIAGNOSIS — H35033 Hypertensive retinopathy, bilateral: Secondary | ICD-10-CM | POA: Diagnosis not present

## 2020-05-27 DIAGNOSIS — H43813 Vitreous degeneration, bilateral: Secondary | ICD-10-CM

## 2020-05-27 DIAGNOSIS — I10 Essential (primary) hypertension: Secondary | ICD-10-CM

## 2020-05-27 DIAGNOSIS — H34812 Central retinal vein occlusion, left eye, with macular edema: Secondary | ICD-10-CM

## 2020-06-28 ENCOUNTER — Encounter (INDEPENDENT_AMBULATORY_CARE_PROVIDER_SITE_OTHER): Payer: Medicare Other | Admitting: Ophthalmology

## 2020-06-28 ENCOUNTER — Other Ambulatory Visit: Payer: Self-pay

## 2020-06-28 DIAGNOSIS — H35033 Hypertensive retinopathy, bilateral: Secondary | ICD-10-CM

## 2020-06-28 DIAGNOSIS — H35372 Puckering of macula, left eye: Secondary | ICD-10-CM | POA: Diagnosis not present

## 2020-06-28 DIAGNOSIS — H34812 Central retinal vein occlusion, left eye, with macular edema: Secondary | ICD-10-CM

## 2020-06-28 DIAGNOSIS — I10 Essential (primary) hypertension: Secondary | ICD-10-CM

## 2020-06-28 DIAGNOSIS — H43813 Vitreous degeneration, bilateral: Secondary | ICD-10-CM

## 2020-07-27 ENCOUNTER — Encounter (INDEPENDENT_AMBULATORY_CARE_PROVIDER_SITE_OTHER): Payer: Medicare Other | Admitting: Ophthalmology

## 2020-07-27 ENCOUNTER — Other Ambulatory Visit: Payer: Self-pay

## 2020-07-27 DIAGNOSIS — H35033 Hypertensive retinopathy, bilateral: Secondary | ICD-10-CM

## 2020-07-27 DIAGNOSIS — H34812 Central retinal vein occlusion, left eye, with macular edema: Secondary | ICD-10-CM

## 2020-07-27 DIAGNOSIS — H35372 Puckering of macula, left eye: Secondary | ICD-10-CM

## 2020-07-27 DIAGNOSIS — H43813 Vitreous degeneration, bilateral: Secondary | ICD-10-CM

## 2020-07-27 DIAGNOSIS — I10 Essential (primary) hypertension: Secondary | ICD-10-CM

## 2020-08-25 ENCOUNTER — Encounter (INDEPENDENT_AMBULATORY_CARE_PROVIDER_SITE_OTHER): Payer: Medicare Other | Admitting: Ophthalmology

## 2020-08-25 ENCOUNTER — Other Ambulatory Visit: Payer: Self-pay

## 2020-08-25 DIAGNOSIS — I10 Essential (primary) hypertension: Secondary | ICD-10-CM

## 2020-08-25 DIAGNOSIS — H2511 Age-related nuclear cataract, right eye: Secondary | ICD-10-CM | POA: Diagnosis not present

## 2020-08-25 DIAGNOSIS — H34812 Central retinal vein occlusion, left eye, with macular edema: Secondary | ICD-10-CM

## 2020-08-25 DIAGNOSIS — H43813 Vitreous degeneration, bilateral: Secondary | ICD-10-CM

## 2020-08-25 DIAGNOSIS — H35033 Hypertensive retinopathy, bilateral: Secondary | ICD-10-CM

## 2020-09-22 ENCOUNTER — Other Ambulatory Visit: Payer: Self-pay

## 2020-09-22 ENCOUNTER — Encounter (INDEPENDENT_AMBULATORY_CARE_PROVIDER_SITE_OTHER): Payer: Medicare Other | Admitting: Ophthalmology

## 2020-09-22 DIAGNOSIS — I10 Essential (primary) hypertension: Secondary | ICD-10-CM

## 2020-09-22 DIAGNOSIS — H34812 Central retinal vein occlusion, left eye, with macular edema: Secondary | ICD-10-CM | POA: Diagnosis not present

## 2020-09-22 DIAGNOSIS — H43813 Vitreous degeneration, bilateral: Secondary | ICD-10-CM | POA: Diagnosis not present

## 2020-09-22 DIAGNOSIS — H35033 Hypertensive retinopathy, bilateral: Secondary | ICD-10-CM

## 2020-09-23 ENCOUNTER — Encounter (INDEPENDENT_AMBULATORY_CARE_PROVIDER_SITE_OTHER): Payer: Medicare Other | Admitting: Ophthalmology

## 2020-10-19 ENCOUNTER — Emergency Department (HOSPITAL_BASED_OUTPATIENT_CLINIC_OR_DEPARTMENT_OTHER)
Admission: EM | Admit: 2020-10-19 | Discharge: 2020-10-19 | Disposition: A | Discharge status: Home / Self Care | Payer: Medicare Other | Attending: Emergency Medicine | Admitting: Emergency Medicine

## 2020-10-19 ENCOUNTER — Other Ambulatory Visit: Payer: Self-pay

## 2020-10-19 ENCOUNTER — Encounter (HOSPITAL_BASED_OUTPATIENT_CLINIC_OR_DEPARTMENT_OTHER): Payer: Self-pay | Admitting: Emergency Medicine

## 2020-10-19 ENCOUNTER — Emergency Department (HOSPITAL_BASED_OUTPATIENT_CLINIC_OR_DEPARTMENT_OTHER): Payer: Medicare Other

## 2020-10-19 DIAGNOSIS — E876 Hypokalemia: Secondary | ICD-10-CM

## 2020-10-19 DIAGNOSIS — I10 Essential (primary) hypertension: Secondary | ICD-10-CM | POA: Insufficient documentation

## 2020-10-19 DIAGNOSIS — E119 Type 2 diabetes mellitus without complications: Secondary | ICD-10-CM | POA: Insufficient documentation

## 2020-10-19 DIAGNOSIS — R42 Dizziness and giddiness: Secondary | ICD-10-CM

## 2020-10-19 DIAGNOSIS — Z79899 Other long term (current) drug therapy: Secondary | ICD-10-CM | POA: Diagnosis not present

## 2020-10-19 DIAGNOSIS — N390 Urinary tract infection, site not specified: Secondary | ICD-10-CM

## 2020-10-19 LAB — CBC
HCT: 38.1 % (ref 36.0–46.0)
Hemoglobin: 13.6 g/dL (ref 12.0–15.0)
MCH: 27.9 pg (ref 26.0–34.0)
MCHC: 35.7 g/dL (ref 30.0–36.0)
MCV: 78.2 fL — ABNORMAL LOW (ref 80.0–100.0)
Platelets: 251 10*3/uL (ref 150–400)
RBC: 4.87 MIL/uL (ref 3.87–5.11)
RDW: 14.4 % (ref 11.5–15.5)
WBC: 7.8 10*3/uL (ref 4.0–10.5)
nRBC: 0 % (ref 0.0–0.2)

## 2020-10-19 LAB — URINALYSIS, ROUTINE W REFLEX MICROSCOPIC
Bilirubin Urine: NEGATIVE
Glucose, UA: NEGATIVE mg/dL
Hgb urine dipstick: NEGATIVE
Ketones, ur: NEGATIVE mg/dL
Nitrite: POSITIVE — AB
Protein, ur: NEGATIVE mg/dL
Specific Gravity, Urine: 1.01 (ref 1.005–1.030)
pH: 6 (ref 5.0–8.0)

## 2020-10-19 LAB — URINALYSIS, MICROSCOPIC (REFLEX)

## 2020-10-19 LAB — COMPREHENSIVE METABOLIC PANEL
ALT: 28 U/L (ref 0–44)
AST: 32 U/L (ref 15–41)
Albumin: 3.9 g/dL (ref 3.5–5.0)
Alkaline Phosphatase: 47 U/L (ref 38–126)
Anion gap: 10 (ref 5–15)
BUN: 28 mg/dL — ABNORMAL HIGH (ref 8–23)
CO2: 29 mmol/L (ref 22–32)
Calcium: 9.3 mg/dL (ref 8.9–10.3)
Chloride: 103 mmol/L (ref 98–111)
Creatinine, Ser: 1.1 mg/dL — ABNORMAL HIGH (ref 0.44–1.00)
GFR, Estimated: 51 mL/min — ABNORMAL LOW (ref >60)
Glucose, Bld: 106 mg/dL — ABNORMAL HIGH (ref 70–99)
Potassium: 2.9 mmol/L — ABNORMAL LOW (ref 3.5–5.1)
Sodium: 142 mmol/L (ref 135–145)
Total Bilirubin: 0.6 mg/dL (ref 0.3–1.2)
Total Protein: 7.3 g/dL (ref 6.5–8.1)

## 2020-10-19 LAB — MAGNESIUM: Magnesium: 1.4 mg/dL — ABNORMAL LOW (ref 1.7–2.4)

## 2020-10-19 LAB — CBG MONITORING, ED: Glucose-Capillary: 179 mg/dL — ABNORMAL HIGH (ref 70–99)

## 2020-10-19 MED ORDER — POTASSIUM CHLORIDE 10 MEQ/100ML IV SOLN
10.0000 meq | Freq: Once | INTRAVENOUS | Status: AC
  Administered 2020-10-19: 10 meq via INTRAVENOUS
  Filled 2020-10-19: qty 100

## 2020-10-19 MED ORDER — POTASSIUM CHLORIDE CRYS ER 20 MEQ PO TBCR: 30.0000 meq | EXTENDED_RELEASE_TABLET | Freq: Once | ORAL | Status: AC

## 2020-10-19 MED ORDER — CEPHALEXIN 500 MG PO CAPS: 500.0000 mg | ORAL_CAPSULE | Freq: Four times a day (QID) | ORAL | 0 refills | Status: AC

## 2020-10-19 MED ORDER — MAGNESIUM SULFATE 50 % IJ SOLN
1.0000 g | Freq: Once | INTRAMUSCULAR | Status: AC
  Administered 2020-10-19: 1 g via INTRAVENOUS
  Filled 2020-10-19: qty 2

## 2020-10-19 MED ORDER — POTASSIUM CHLORIDE CRYS ER 20 MEQ PO TBCR
EXTENDED_RELEASE_TABLET | ORAL | Status: AC
  Administered 2020-10-19: 30 meq via ORAL
  Filled 2020-10-19: qty 2

## 2020-10-19 MED ORDER — SODIUM CHLORIDE 0.9 % IV SOLN: INTRAVENOUS | Status: DC | PRN

## 2020-10-19 MED ORDER — CEPHALEXIN 250 MG PO CAPS
500.0000 mg | ORAL_CAPSULE | Freq: Once | ORAL | Status: AC
  Administered 2020-10-19: 500 mg via ORAL
  Filled 2020-10-19: qty 2

## 2020-10-19 NOTE — Discharge Instructions (Addendum)
You came to the emergency department to be evaluated for your dizziness.  Your physical exam was reassuring.  The CT scan of your head showed no acute bleed or injury.  Your lab work showed that your potassium and magnesium were low.  You were given potassium and magnesium in the emergency department. Your your urinalysis showed that you had a urinary tract infection.  You were given 1 dose of Keflex in the emergency department.  You were also given a prescription for Keflex.  Please take 1 pill 4 times a day for the next 7 days.  Please follow-up with your primary care provider for reevaluation of your potassium.    Get help right away if: You vomit or have diarrhea and are unable to eat or drink anything. You have problems talking, walking, swallowing, or using your arms, hands, or legs. You feel generally weak. You are not thinking clearly or you have trouble forming sentences. It may take a friend or family member to notice this. You have chest pain, abdominal pain, shortness of breath, or sweating. Your vision changes. You have any bleeding. You have a severe headache. You have neck pain or a stiff neck. You have a fever.

## 2020-10-19 NOTE — ED Notes (Signed)
Pt lying flat at 1505 for orthostatic vitals.

## 2020-10-19 NOTE — ED Triage Notes (Signed)
Recently treated for left eye infection.  Pt states she started having some dizziness, similar to previous vertigo episodes.  Pt continues to have dizziness, with room spinning.  No neuro deficits noted presently.

## 2020-10-19 NOTE — ED Provider Notes (Signed)
[facility] EMERGENCY DEPARTMENT Provider Note   CSN: [id number] Arrival date & time: 10/19/20  1306     History Chief Complaint  Patient presents with  . Dizziness    Kimberly Simmons is a 80 y.o. female with past medical history of hypertension, diabetes.  Patient presents with chief complaint of dizziness.  She reports that dizziness began this morning after she quickly got out of bed and started walking.  Patient reports that she took a dose of meclizine and her dizziness resolved.  Patient reports that she then had another episode around noon while at work.  She reports it felt like the room was spinning.  This episode resolved spontaneously on its own.  Patient denies any dizziness or lightheadedness at present.  Reports this feels similar to previous vertigo episode she has had in the past.  Patient denies any associated chest pain, shortness of breath, lightheadedness, syncopal episodes, facial drooping, slurred speech, tremors, seizure-like activity, numbness, weakness.  She denies any recent falls or injuries.  She denies taking any blood thinners.          HPI     Past Medical History:  Diagnosis Date  . Diabetes mellitus without complication (HCC)   . Hypertension     There are no problems to display for this patient.   No past surgical history on file.   OB History   No obstetric history on file.     No family history on file.  Social History   Tobacco Use  . Smoking status: Never Smoker  . Smokeless tobacco: Never Used  Substance Use Topics  . Alcohol use: No  . Drug use: No    Home Medications Prior to Admission medications   Medication Sig Start Date End Date Taking? Authorizing Provider  amLODipine (NORVASC) 10 MG tablet Take 1 tablet (10 mg total) by mouth daily. 12/10/14   Rodolph Bong, MD  atorvastatin (LIPITOR) 20 MG tablet Take 20 mg by mouth daily. 07/11/19   [provider]  cloNIDine (CATAPRES) 0.1 MG tablet Take 0.1  mg by mouth 2 (two) times daily.     [provider]  meclizine (ANTIVERT) 12.5 MG tablet Take 1 tablet (12.5 mg total) by mouth 3 (three) times daily as needed for dizziness. 09/02/19   Joy, Shawn C, PA-C  nebivolol (BYSTOLIC) 10 MG tablet TAKE 1 TABLET BY MOUTH ONCE DAILY IN THE MORNING FOR 30 DAYS 10/13/20   [provider]  TRADJENTA 5 MG TABS tablet Take 5 mg by mouth daily.  06/05/19   [provider]  traMADol (ULTRAM) 50 MG tablet Take 50 mg by mouth 2 (two) times daily as needed. 05/05/20   [provider]    Allergies    Patient has no known allergies.  Review of Systems   Review of Systems  Constitutional: Negative for chills and fever.  Eyes: Negative for visual disturbance.  Respiratory: Negative for shortness of breath.   Cardiovascular: Negative for chest pain.  Gastrointestinal: Negative for abdominal pain, nausea and vomiting.  Genitourinary: Negative for difficulty urinating, dysuria and frequency.  Musculoskeletal: Negative for back pain and neck pain.  Skin: Negative for color change and rash.  Neurological: Positive for dizziness. Negative for tremors, seizures, syncope, facial asymmetry, speech difficulty, weakness, light-headedness, numbness and headaches.  Psychiatric/Behavioral: Negative for confusion.    Physical Exam Updated Vital Signs BP (!) 132/59   Pulse (!) 57   Temp 98.2 F (36.8 C)   Resp  18   Ht 4\' 9"  (1.448 m)   Wt 78 kg   SpO2 98%   BMI 37.22 kg/m   Physical Exam Vitals and nursing note reviewed.  Constitutional:      General: She is not in acute distress.    Appearance: She is obese. She is not ill-appearing, toxic-appearing or diaphoretic.  HENT:     Head: Normocephalic and atraumatic.  Eyes:     General: No scleral icterus.       Right eye: No discharge.        Left eye: No discharge.     Extraocular Movements: Extraocular movements intact.     Pupils: Pupils are equal, round, and reactive to  light.  Cardiovascular:     Rate and Rhythm: Normal rate.  Pulmonary:     Effort: Pulmonary effort is normal. No respiratory distress.     Breath sounds: No stridor. No wheezing, rhonchi or rales.  Abdominal:     General: There is no distension. There are no signs of injury.     Palpations: Abdomen is soft. There is no mass or pulsatile mass.     Tenderness: There is no abdominal tenderness. There is no guarding or rebound.  Musculoskeletal:     Cervical back: Normal range of motion and neck supple. No rigidity or tenderness.     Right lower leg: No swelling, deformity, lacerations, tenderness or bony tenderness. No edema.     Left lower leg: No swelling, deformity, lacerations, tenderness or bony tenderness. No edema.  Skin:    General: Skin is warm and dry.     Coloration: Skin is not jaundiced or pale.  Neurological:     General: No focal deficit present.     Mental Status: She is alert.     GCS: GCS eye subscore is 4. GCS verbal subscore is 5. GCS motor subscore is 6.     Cranial Nerves: No cranial nerve deficit or facial asymmetry.     Sensory: Sensation is intact.     Motor: No weakness, tremor, seizure activity or pronator drift.     Coordination: Romberg sign negative. Finger-Nose-Finger Test normal.     Gait: Gait is intact. Gait normal.     Comments: CN II-XII intact, equal grip strength, +5 strength to bilateral upper and lower extremities     Psychiatric:        Behavior: Behavior is cooperative.     ED Results / Procedures / Treatments   Labs (all labs ordered are listed, but only abnormal results are displayed) Labs Reviewed  CBC - Abnormal; Notable for the following components:      Result Value   MCV 78.2 (*)    All other components within normal limits  URINALYSIS, ROUTINE W REFLEX MICROSCOPIC - Abnormal; Notable for the following components:   APPearance HAZY (*)    Nitrite POSITIVE (*)    Leukocytes,Ua LARGE (*)    All other components within normal  limits  COMPREHENSIVE METABOLIC PANEL - Abnormal; Notable for the following components:   Potassium 2.9 (*)    Glucose, Bld 106 (*)    BUN 28 (*)    Creatinine, Ser 1.10 (*)    GFR, Estimated 51 (*)    All other components within normal limits  URINALYSIS, MICROSCOPIC (REFLEX) - Abnormal; Notable for the following components:   Bacteria, UA MANY (*)    All other components within normal limits  MAGNESIUM - Abnormal; Notable for the following components:   Magnesium  1.4 (*)    All other components within normal limits  CBG MONITORING, ED - Abnormal; Notable for the following components:   Glucose-Capillary 179 (*)    All other components within normal limits  URINE CULTURE    EKG EKG Interpretation  Date/Time:  Wednesday October 19 2020 13:28:44 EST Ventricular Rate:  67 PR Interval:    QRS Duration: 90 QT Interval:  400 QTC Calculation: 423 R Axis:   51 Text Interpretation: Sinus rhythm Baseline wander in lead(s) V2 no acute st/ts No significant change since prior 12/20 Confirmed by Meridee Score [phone]) on 10/19/2020 1:34:53 PM   Radiology CT Head Wo Contrast  Result Date: 10/19/2020 CLINICAL DATA:  Dizziness EXAM: CT HEAD WITHOUT CONTRAST TECHNIQUE: Contiguous axial images were obtained from the base of the skull through the vertex without intravenous contrast. COMPARISON:  09/01/2019 FINDINGS: Brain: There is no acute intracranial hemorrhage, mass effect, or edema. Gray-white differentiation is preserved. There is no extra-axial fluid collection. Ventricles and sulci are stable in size and configuration. Confluent areas of hypoattenuation in the supratentorial white matter are nonspecific but probably reflect stable advanced chronic microvascular ischemic changes. Vascular: There is atherosclerotic calcification at the skull base. Skull: Calvarium is unremarkable. Sinuses/Orbits: No acute finding. Other: None. IMPRESSION: No acute intracranial abnormality. Stable chronic  findings detailed above. Electronically Signed   By: Guadlupe Spanish M.D.   On: 10/19/2020 16:36    Procedures Procedures  Medications Ordered in ED Medications  potassium chloride 10 mEq in 100 mL IVPB (0 mEq Intravenous Stopped 10/19/20 1744)  potassium chloride SA (KLOR-CON) CR tablet 30 mEq (30 mEq Oral Given 10/19/20 1636)  magnesium sulfate (IV Push/IM) injection 1 g (1 g Intravenous Given 10/19/20 1828)  cephALEXin (KEFLEX) capsule 500 mg (500 mg Oral Given 10/19/20 1831)    ED Course  I have reviewed the triage vital signs and the nursing notes.  Pertinent labs & imaging results that were available during my care of the patient were reviewed by me and considered in my medical decision making (see chart for details).    MDM Rules/Calculators/A&P                          Alert and oriented 80 year old female in no acute distress, nontoxic-appearing presents with chief complaint of dizziness.  Patient reports 2 episodes of dizziness today.  Patient's first set of dizziness was resolved when taking meclizine.  Patient reports that her second episode of dizziness resolved on time.  Patient denies any associated lightheadedness, facial asymmetry, slurred speech.  Patient reports that her episode of dizziness felt like the room was spinning.  Patient reports that this feels like similar episodes of her vertigo.  Patient denies any recent falls or injuries.  Per chart review patient was seen 09/01/2020 for complaints of dizziness and ataxia.  Patient had head CT and MRI; no acute abnormality.  Patient has no neurological deficits noted on physical exam.  Patient able to stand and ambulate without complaints of dizziness.    CBC is unremarkable, CBG 179.  CMP pending, UA pending.  Will obtain noncontrast head CT.  CMP shows potassium 2.9, BUN 28, creatinine 1.10.  Will administer 10 mEq potassium via IV and 30 mEq orally.  Will give patient sodium chloride bolus.  Will check magnesium.   Magnesium was found to be decreased at 1.4 patient was given 1 g magnesium sulfate.    Urinalysis showed bacteria many, WBC 21-50, RBC 6-10,  nitrate positive, leukocytes large.  Findings consistent with urinary tract infection.  We will send for culture.  Per chart review previous culture showed no resistant bacteria grown.  We will start patient on Keflex in emergency department and discharged with 7-day course.  Noncontrast CT head scan showed no acute intracranial abnormality.  On serial reexamination patient remains alert, oriented, no focal neurological deficits noted.  Patient reports that she is feeling much better.  Will have patient follow-up with her primary care provider to reassess potassium and magnesium.  Discussed results, findings, treatment and follow up. Patient advised of return precautions. Patient verbalized understanding and agreed with plan.   Final Clinical Impression(s) / ED Diagnoses Final diagnoses:  Dizziness  Urinary tract infection without hematuria, site unspecified  Hypokalemia  Hypomagnesemia    Rx / DC Orders ED Discharge Orders         Ordered    cephALEXin (KEFLEX) 500 MG capsule  4 times daily        10/19/20 1818           Berneice HeinrichBadalamente, Lindsea Olivar R, PA-C 10/20/20 0113    Little, Ambrose Finlandachel Morgan, MD 10/22/20 [phone]

## 2020-10-19 NOTE — ED Notes (Signed)
Pt stated that she felt much better abd felt more like herself. Family at bedside

## 2020-10-21 ENCOUNTER — Encounter (INDEPENDENT_AMBULATORY_CARE_PROVIDER_SITE_OTHER): Payer: Medicare Other | Admitting: Ophthalmology

## 2020-10-22 LAB — URINE CULTURE: Culture: >=100000 — AB

## 2020-10-25 ENCOUNTER — Encounter (INDEPENDENT_AMBULATORY_CARE_PROVIDER_SITE_OTHER): Payer: Medicare Other | Admitting: Ophthalmology

## 2020-10-25 ENCOUNTER — Other Ambulatory Visit: Payer: Self-pay

## 2020-10-25 DIAGNOSIS — H35033 Hypertensive retinopathy, bilateral: Secondary | ICD-10-CM | POA: Diagnosis not present

## 2020-10-25 DIAGNOSIS — H34812 Central retinal vein occlusion, left eye, with macular edema: Secondary | ICD-10-CM

## 2020-10-25 DIAGNOSIS — H43813 Vitreous degeneration, bilateral: Secondary | ICD-10-CM

## 2020-10-25 DIAGNOSIS — I10 Essential (primary) hypertension: Secondary | ICD-10-CM | POA: Diagnosis not present

## 2020-11-22 ENCOUNTER — Encounter (INDEPENDENT_AMBULATORY_CARE_PROVIDER_SITE_OTHER): Payer: Medicare Other | Admitting: Ophthalmology

## 2020-11-25 ENCOUNTER — Encounter (INDEPENDENT_AMBULATORY_CARE_PROVIDER_SITE_OTHER): Payer: Medicare Other | Admitting: Ophthalmology

## 2020-11-25 ENCOUNTER — Other Ambulatory Visit: Payer: Self-pay

## 2020-11-25 DIAGNOSIS — I10 Essential (primary) hypertension: Secondary | ICD-10-CM | POA: Diagnosis not present

## 2020-11-25 DIAGNOSIS — H35033 Hypertensive retinopathy, bilateral: Secondary | ICD-10-CM | POA: Diagnosis not present

## 2020-11-25 DIAGNOSIS — H43813 Vitreous degeneration, bilateral: Secondary | ICD-10-CM | POA: Diagnosis not present

## 2020-11-25 DIAGNOSIS — H34812 Central retinal vein occlusion, left eye, with macular edema: Secondary | ICD-10-CM

## 2020-11-25 DIAGNOSIS — H2511 Age-related nuclear cataract, right eye: Secondary | ICD-10-CM

## 2020-12-30 ENCOUNTER — Other Ambulatory Visit: Payer: Self-pay

## 2020-12-30 ENCOUNTER — Encounter (INDEPENDENT_AMBULATORY_CARE_PROVIDER_SITE_OTHER): Payer: Medicare Other | Admitting: Ophthalmology

## 2020-12-30 DIAGNOSIS — H43813 Vitreous degeneration, bilateral: Secondary | ICD-10-CM

## 2020-12-30 DIAGNOSIS — H35033 Hypertensive retinopathy, bilateral: Secondary | ICD-10-CM

## 2020-12-30 DIAGNOSIS — H34812 Central retinal vein occlusion, left eye, with macular edema: Secondary | ICD-10-CM | POA: Diagnosis not present

## 2020-12-30 DIAGNOSIS — I10 Essential (primary) hypertension: Secondary | ICD-10-CM

## 2021-01-11 DIAGNOSIS — E669 Obesity, unspecified: Secondary | ICD-10-CM | POA: Insufficient documentation

## 2021-02-01 ENCOUNTER — Encounter (INDEPENDENT_AMBULATORY_CARE_PROVIDER_SITE_OTHER): Payer: Medicare Other | Admitting: Ophthalmology

## 2021-02-06 ENCOUNTER — Encounter (INDEPENDENT_AMBULATORY_CARE_PROVIDER_SITE_OTHER): Payer: Medicare Other | Admitting: Ophthalmology

## 2021-02-06 ENCOUNTER — Other Ambulatory Visit: Payer: Self-pay

## 2021-02-06 DIAGNOSIS — I10 Essential (primary) hypertension: Secondary | ICD-10-CM | POA: Diagnosis not present

## 2021-02-06 DIAGNOSIS — H34812 Central retinal vein occlusion, left eye, with macular edema: Secondary | ICD-10-CM

## 2021-02-06 DIAGNOSIS — H43813 Vitreous degeneration, bilateral: Secondary | ICD-10-CM | POA: Diagnosis not present

## 2021-02-06 DIAGNOSIS — H35033 Hypertensive retinopathy, bilateral: Secondary | ICD-10-CM

## 2021-03-13 ENCOUNTER — Encounter (INDEPENDENT_AMBULATORY_CARE_PROVIDER_SITE_OTHER): Payer: Medicare Other | Admitting: Ophthalmology

## 2021-03-17 ENCOUNTER — Other Ambulatory Visit: Payer: Self-pay

## 2021-03-17 ENCOUNTER — Encounter (INDEPENDENT_AMBULATORY_CARE_PROVIDER_SITE_OTHER): Payer: Medicare Other | Admitting: Ophthalmology

## 2021-03-17 DIAGNOSIS — I10 Essential (primary) hypertension: Secondary | ICD-10-CM

## 2021-03-17 DIAGNOSIS — H35033 Hypertensive retinopathy, bilateral: Secondary | ICD-10-CM | POA: Diagnosis not present

## 2021-03-17 DIAGNOSIS — H34812 Central retinal vein occlusion, left eye, with macular edema: Secondary | ICD-10-CM | POA: Diagnosis not present

## 2021-03-17 DIAGNOSIS — H35372 Puckering of macula, left eye: Secondary | ICD-10-CM | POA: Diagnosis not present

## 2021-03-17 DIAGNOSIS — H43813 Vitreous degeneration, bilateral: Secondary | ICD-10-CM

## 2021-04-12 DIAGNOSIS — M159 Polyosteoarthritis, unspecified: Secondary | ICD-10-CM | POA: Insufficient documentation

## 2021-04-14 ENCOUNTER — Encounter (INDEPENDENT_AMBULATORY_CARE_PROVIDER_SITE_OTHER): Payer: Medicare Other | Admitting: Ophthalmology

## 2021-04-14 ENCOUNTER — Other Ambulatory Visit: Payer: Self-pay | Admitting: Nurse Practitioner

## 2021-04-14 DIAGNOSIS — H35033 Hypertensive retinopathy, bilateral: Secondary | ICD-10-CM

## 2021-04-14 DIAGNOSIS — H43813 Vitreous degeneration, bilateral: Secondary | ICD-10-CM

## 2021-04-14 DIAGNOSIS — Z13820 Encounter for screening for osteoporosis: Secondary | ICD-10-CM

## 2021-04-14 DIAGNOSIS — H2511 Age-related nuclear cataract, right eye: Secondary | ICD-10-CM

## 2021-04-14 DIAGNOSIS — H34812 Central retinal vein occlusion, left eye, with macular edema: Secondary | ICD-10-CM

## 2021-04-14 DIAGNOSIS — I10 Essential (primary) hypertension: Secondary | ICD-10-CM

## 2021-05-12 ENCOUNTER — Other Ambulatory Visit: Payer: Self-pay

## 2021-05-12 ENCOUNTER — Encounter (INDEPENDENT_AMBULATORY_CARE_PROVIDER_SITE_OTHER): Payer: Medicare Other | Admitting: Ophthalmology

## 2021-05-12 DIAGNOSIS — H35033 Hypertensive retinopathy, bilateral: Secondary | ICD-10-CM | POA: Diagnosis not present

## 2021-05-12 DIAGNOSIS — H2511 Age-related nuclear cataract, right eye: Secondary | ICD-10-CM

## 2021-05-12 DIAGNOSIS — H34812 Central retinal vein occlusion, left eye, with macular edema: Secondary | ICD-10-CM | POA: Diagnosis not present

## 2021-05-12 DIAGNOSIS — H43813 Vitreous degeneration, bilateral: Secondary | ICD-10-CM

## 2021-05-12 DIAGNOSIS — I10 Essential (primary) hypertension: Secondary | ICD-10-CM | POA: Diagnosis not present

## 2021-05-15 DIAGNOSIS — M545 Low back pain, unspecified: Secondary | ICD-10-CM | POA: Insufficient documentation

## 2021-05-15 DIAGNOSIS — G8929 Other chronic pain: Secondary | ICD-10-CM | POA: Insufficient documentation

## 2021-05-25 ENCOUNTER — Ambulatory Visit: Payer: Medicare Other | Admitting: Internal Medicine

## 2021-06-02 ENCOUNTER — Ambulatory Visit: Payer: Medicare Other | Admitting: Internal Medicine

## 2021-06-09 ENCOUNTER — Other Ambulatory Visit: Payer: Self-pay

## 2021-06-09 ENCOUNTER — Encounter (INDEPENDENT_AMBULATORY_CARE_PROVIDER_SITE_OTHER): Payer: Medicare Other | Admitting: Ophthalmology

## 2021-06-09 DIAGNOSIS — H34812 Central retinal vein occlusion, left eye, with macular edema: Secondary | ICD-10-CM

## 2021-06-09 DIAGNOSIS — H43813 Vitreous degeneration, bilateral: Secondary | ICD-10-CM | POA: Diagnosis not present

## 2021-06-09 DIAGNOSIS — H35033 Hypertensive retinopathy, bilateral: Secondary | ICD-10-CM | POA: Diagnosis not present

## 2021-06-09 DIAGNOSIS — I10 Essential (primary) hypertension: Secondary | ICD-10-CM | POA: Diagnosis not present

## 2021-07-07 ENCOUNTER — Encounter (INDEPENDENT_AMBULATORY_CARE_PROVIDER_SITE_OTHER): Payer: Medicare Other | Admitting: Ophthalmology

## 2021-07-07 ENCOUNTER — Other Ambulatory Visit: Payer: Self-pay

## 2021-07-07 DIAGNOSIS — H43813 Vitreous degeneration, bilateral: Secondary | ICD-10-CM | POA: Diagnosis not present

## 2021-07-07 DIAGNOSIS — I10 Essential (primary) hypertension: Secondary | ICD-10-CM

## 2021-07-07 DIAGNOSIS — H35033 Hypertensive retinopathy, bilateral: Secondary | ICD-10-CM

## 2021-07-07 DIAGNOSIS — H34812 Central retinal vein occlusion, left eye, with macular edema: Secondary | ICD-10-CM | POA: Diagnosis not present

## 2021-08-04 ENCOUNTER — Encounter (INDEPENDENT_AMBULATORY_CARE_PROVIDER_SITE_OTHER): Payer: Medicare Other | Admitting: Ophthalmology

## 2021-08-04 ENCOUNTER — Other Ambulatory Visit: Payer: Self-pay

## 2021-08-04 DIAGNOSIS — H34812 Central retinal vein occlusion, left eye, with macular edema: Secondary | ICD-10-CM

## 2021-08-04 DIAGNOSIS — H43813 Vitreous degeneration, bilateral: Secondary | ICD-10-CM | POA: Diagnosis not present

## 2021-08-04 DIAGNOSIS — I10 Essential (primary) hypertension: Secondary | ICD-10-CM

## 2021-08-04 DIAGNOSIS — H35033 Hypertensive retinopathy, bilateral: Secondary | ICD-10-CM | POA: Diagnosis not present

## 2021-09-04 ENCOUNTER — Encounter (INDEPENDENT_AMBULATORY_CARE_PROVIDER_SITE_OTHER): Payer: Medicare Other | Admitting: Ophthalmology

## 2021-09-04 ENCOUNTER — Other Ambulatory Visit: Payer: Self-pay

## 2021-09-04 DIAGNOSIS — I10 Essential (primary) hypertension: Secondary | ICD-10-CM | POA: Diagnosis not present

## 2021-09-04 DIAGNOSIS — H34812 Central retinal vein occlusion, left eye, with macular edema: Secondary | ICD-10-CM | POA: Diagnosis not present

## 2021-09-04 DIAGNOSIS — H2511 Age-related nuclear cataract, right eye: Secondary | ICD-10-CM

## 2021-09-04 DIAGNOSIS — H43813 Vitreous degeneration, bilateral: Secondary | ICD-10-CM | POA: Diagnosis not present

## 2021-09-04 DIAGNOSIS — H35033 Hypertensive retinopathy, bilateral: Secondary | ICD-10-CM | POA: Diagnosis not present

## 2021-09-04 DIAGNOSIS — H2702 Aphakia, left eye: Secondary | ICD-10-CM

## 2021-09-07 ENCOUNTER — Other Ambulatory Visit: Payer: Self-pay

## 2021-09-07 ENCOUNTER — Encounter (INDEPENDENT_AMBULATORY_CARE_PROVIDER_SITE_OTHER): Payer: Medicare Other | Admitting: Ophthalmology

## 2021-09-07 DIAGNOSIS — H34812 Central retinal vein occlusion, left eye, with macular edema: Secondary | ICD-10-CM

## 2021-09-12 ENCOUNTER — Ambulatory Visit: Payer: Medicare Other | Admitting: Physical Therapy

## 2021-09-13 ENCOUNTER — Encounter: Payer: Self-pay | Admitting: Physical Therapy

## 2021-09-13 ENCOUNTER — Other Ambulatory Visit: Payer: Self-pay

## 2021-09-13 ENCOUNTER — Ambulatory Visit: Payer: Medicare Other | Attending: Family Medicine | Admitting: Physical Therapy

## 2021-09-13 DIAGNOSIS — M5441 Lumbago with sciatica, right side: Secondary | ICD-10-CM | POA: Insufficient documentation

## 2021-09-13 DIAGNOSIS — M5442 Lumbago with sciatica, left side: Secondary | ICD-10-CM | POA: Diagnosis not present

## 2021-09-13 DIAGNOSIS — M5416 Radiculopathy, lumbar region: Secondary | ICD-10-CM | POA: Diagnosis present

## 2021-09-13 DIAGNOSIS — M6281 Muscle weakness (generalized): Secondary | ICD-10-CM | POA: Diagnosis present

## 2021-09-13 DIAGNOSIS — G8929 Other chronic pain: Secondary | ICD-10-CM | POA: Diagnosis present

## 2021-09-13 NOTE — Therapy (Signed)
[facility] [address]  [address] [place], [place], [address] Phone: [phone]   Fax:  [phone]  Physical Therapy Evaluation  Patient Details  Name: Kimberly BeetsFaye C Amaro MRN: [id number] Date of Birth: 20-Jul-1941 Referring Provider (PT): Eula Listenominic McKinley, MD   Encounter Date: 09/13/2021   PT End of Session - 09/13/21 1018     Visit Number 1    Number of Visits 12    Date for PT Re-Evaluation 10/25/21    Authorization Type [employer] Medicare    PT Start Time 1018    PT Stop Time 1108    PT Time Calculation (min) 50 min    Activity Tolerance Patient tolerated treatment well    Behavior During Therapy [facility] for tasks assessed/performed             Past Medical History:  Diagnosis Date   Diabetes mellitus without complication (HCC)    Hypertension     History reviewed. No pertinent surgical history.  There were no vitals filed for this visit.    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 She states she woke up with terrible pain one morning with pain down both legs so she went to she the MD. Pain currently not like it was, but still present intermittently, although she states seh does not let it stop her from doing what she wants.    Patient Stated Goals "I would like some pointers on exercises and activities I can do for my back"    Currently in Pain? No/denies    Pain Score 0-No pain   2-3/10 in the morning upon getting outof bed; up to 8-9/10 at worst   Pain Location Back    Pain Orientation Lower;Right;Left    Pain Descriptors / Indicators Dull;Throbbing;Heaviness    Pain Type Chronic pain    Pain Radiating Towards occasional pain and tingling into B posterior LEs    Pain Onset Other (comment)   off an on for a long time   Pain Frequency Intermittent    Aggravating Factors  initially upon getting out of bed in the morning; otherwise  unpredictable    Pain Relieving Factors sit down and rest, heating pad, mind over matter    Effect of Pain on Daily Activities she doesn't let the pain stop her                [facility] - 09/13/21 1018       Assessment   Medical Diagnosis LBP and lumbar stenosis    Referring Provider (PT) Eula Listenominic McKinley, MD    Onset Date/Surgical Date --   chronic   Hand Dominance Left    Next MD Visit PRN    Prior Therapy h/o PT for sciatica      Precautions   Precautions None      Restrictions   Weight Bearing Restrictions No      Balance Screen   Has the patient fallen in the past 6 months No    Has the patient had a decrease in activity level because of a fear of falling?  No    Is the patient reluctant to leave their home because of a fear of falling?  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    Leisure walking daily - in the bus yard or at home ~30 min; yardwork/gardening      Cognition   Overall Cognitive Status Within Functional Limits for tasks assessed      Observation/Other Assessments   Focus on Therapeutic Outcomes (FOTO)  Lumbar = 77 (risk adjusted = 42); predicted D/C FS = 78      Posture/Postural Control   Posture/Postural Control No significant limitations      ROM / Strength   AROM / PROM / Strength AROM;Strength      AROM   Overall AROM  Within functional limits for tasks performed    AROM Assessment Site Lumbar      Strength   Strength Assessment Site Hip;Knee;Ankle    Right/Left Hip Right;Left    Right Hip  Flexion 4/5    Right Hip Extension 4-/5    Right Hip External Rotation  4/5    Right Hip Internal Rotation 4+/5    Right Hip ABduction 4/5    Right Hip ADduction 4/5    Left Hip Flexion 4-/5    Left Hip Extension 4-/5    Left Hip External Rotation 4-/5    Left Hip Internal Rotation 4/5    Left Hip ABduction 4-/5    Left Hip ADduction 4-/5    Right/Left Knee Right;Left    Right Knee Flexion 4+/5    Right Knee Extension 4+/5    Left Knee Flexion 4/5    Left Knee Extension 4/5    Right/Left Ankle Right;Left    Right Ankle Dorsiflexion 4+/5    Right Ankle Plantar Flexion 4-/5    Left Ankle Dorsiflexion 4/5    Left Ankle Plantar Flexion 3+/5      Flexibility   Soft Tissue Assessment /Muscle Length yes    Hamstrings WFL    Quadriceps mod tight B    ITB mild tight B    Piriformis mod/severe tight B      Palpation   Palpation comment mild TTP mainly in L>R glutes and piriformis with pt denying TTP in lumbar paraspinals                        Objective measurements completed on examination: See above findings.       [facility] Adult PT Treatment/Exercise - 09/13/21 1018       Exercises   Exercises Lumbar      Lumbar Exercises: Stretches   Piriformis Stretch Right;Left;1 rep;30 seconds    Piriformis Stretch Limitations supine KTOS   pt noting good stretch   Figure 4 Stretch 1 rep;30 seconds;Supine;With overpressure      Lumbar Exercises: Supine   Ab Set 10 reps;5 seconds    Pelvic Tilt 10 reps;5 seconds    Pelvic Tilt Limitations PPT                     PT Education - 09/13/21 1105     Education Details PT eval findings, anticipated POC and initial HEP - Access Code: [email or web address]  Person(s) Educated Patient    Methods Explanation;Demonstration;Verbal cues;Handout    Comprehension Verbalized understanding;Verbal cues required;Returned demonstration;Need further instruction              PT Short Term Goals - 09/13/21 1108       PT SHORT  TERM GOAL #1   Title Patient will be independent with initial HEP    Status New    Target Date 10/04/21               PT Long Term Goals - 09/13/21 1108       PT LONG TERM GOAL #1   Title Patient will be independent with ongoing/advanced HEP for self-management at home    Status New    Target Date 10/25/21      PT LONG TERM GOAL #2   Title Patient to demonstrate ability to achieve and maintain good spinal alignment/posturing and body mechanics needed for daily activities    Status New    Target Date 10/25/21      PT LONG TERM GOAL #3   Title Patient to demonstrate improved tissue quality and pliability as noted by reduced tissue tightness and tenderness to palpation    Status New    Target Date 10/25/21      PT LONG TERM GOAL #4   Title Patient will demonstrate improved B LE strength to >/= 4 to 4+/5 for improved stability and ease of mobility    Status New    Target Date 10/25/21      PT LONG TERM GOAL #5   Title Patient to report ability to perform ADLs, household, and work-related tasks without increased pain    Status New    Target Date 10/25/21                    Plan - 09/13/21 1108     Clinical Impression Statement Lucendia HerrlichFaye is an 81 y/o female who presents to OP PT with acute on chronic LBP with intermittent B LE radiculopathy due to lumbar stenosis. She reports severe episode of pain a few weeks ago which led her to go see the MD, but states pain has been less intense and more intermittent since, typically bothering her most initially upon rising from the bed in the morning. Lumbar ROM WNL w/o pain with some proximal LE tightness evident along with mild TTP in glutes/piriformis and decreased L>R LE weakness. She states she does not let the pain stop her from doing anything she wants/needs to do but would like some guidance in exercises and activities she can do for her back. Lucendia HerrlichFaye will benefit from skilled PT to address above deficits and improve proximal LE  flexibility and core/LE strength to decrease pain and LE radiculopathy.    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, bladder infections    Examination-Activity Limitations Transfers;Stairs    Stability/Clinical Decision Making Stable/Uncomplicated    Clinical Decision Making Low    Rehab Potential Good    PT Frequency 2x / week    PT Duration 6 weeks   4-6 weeks   PT Treatment/Interventions ADLs/Self Care Home Management;Cryotherapy;Electrical Stimulation;Iontophoresis 4mg /ml Dexamethasone;Moist Heat;Traction;Ultrasound;Functional mobility training;Therapeutic activities;Therapeutic exercise;Balance training;Neuromuscular re-education;Patient/family education;Manual techniques;Passive range of motion;Dry needling;Taping;Spinal Manipulations    PT Next Visit Plan Review initial HEP and progress lumbopelvic flexibilty and strengthening (no directional preference noted on eval); posture and body mechanics education    PT Home  Exercise Plan Access Code: [email or web address] ([email or web address])    Consulted and Agree with Plan of Care Patient             Patient will benefit from skilled therapeutic intervention in order to improve the following deficits and impairments:  Decreased knowledge of precautions, Decreased strength, Increased fascial restricitons, Increased muscle spasms, Impaired perceived functional ability, Impaired flexibility, Improper body mechanics, Postural dysfunction, Pain  Visit Diagnosis: Chronic bilateral low back pain with bilateral sciatica  Radiculopathy, lumbar region  Muscle weakness (generalized)     Problem List There are no problems to display for this patient.   Marry Guan, PT 09/13/2021, 1:18 PM  [facility] [address]  [address] [place], [place],  [address] Phone: [phone]   Fax:  [phone]  Name: SHAMAYA KAUER MRN: [id number] Date of Birth: [birth year]

## 2021-09-13 NOTE — Patient Instructions (Signed)
° °  Access Code: [email or web address] URL: [email or web address] Date: 09/13/2021 Prepared by: Glenetta Hew  Exercises Supine Figure 4 Piriformis Stretch - 2-3 x daily - 7 x weekly - 3 reps - 30 sec hold Supine Piriformis Stretch with Foot on Ground - 2-3 x daily - 7 x weekly - 3 reps - 30 sec hold Supine Posterior Pelvic Tilt - 1 x daily - 7 x weekly - 2 sets - 10 reps - 5 sec hold

## 2021-09-14 ENCOUNTER — Encounter: Payer: Self-pay | Admitting: Physical Therapy

## 2021-09-14 ENCOUNTER — Ambulatory Visit: Payer: Medicare Other | Admitting: Physical Therapy

## 2021-09-14 DIAGNOSIS — M5442 Lumbago with sciatica, left side: Secondary | ICD-10-CM

## 2021-09-14 DIAGNOSIS — G8929 Other chronic pain: Secondary | ICD-10-CM

## 2021-09-14 DIAGNOSIS — M6281 Muscle weakness (generalized): Secondary | ICD-10-CM

## 2021-09-14 DIAGNOSIS — M5416 Radiculopathy, lumbar region: Secondary | ICD-10-CM

## 2021-09-14 NOTE — Patient Instructions (Signed)
Access Code: [email or web address] URL: [email or web address] Date: 09/14/2021 Prepared by: Raynelle Fanning  Exercises Supine Figure 4 Piriformis Stretch - 2-3 x daily - 7 x weekly - 3 reps - 30 sec hold Supine Piriformis Stretch with Foot on Ground - 2-3 x daily - 7 x weekly - 3 reps - 30 sec hold Supine Posterior Pelvic Tilt - 1 x daily - 7 x weekly - 2 sets - 10 reps - 5 sec hold Supine Bridge - 2 x daily - 4 x weekly - 2 sets - 10 reps Seated Heel Raise - 2 x daily - 7 x weekly - 1 sets - 5-10 reps - 5 sec hold

## 2021-09-14 NOTE — Therapy (Signed)
[facility] [address]  [address] [place], [place], [address] Phone: [phone]   Fax:  [phone]  Physical Therapy Treatment  Patient Details  Name: Kimberly Simmons MRN: [id number] Date of Birth: 03/22/1941 Referring Provider (PT): Kimberly Listen, MD   Encounter Date: 09/14/2021   PT End of Session - 09/14/21 1023     Visit Number 2    Number of Visits 12    Date for PT Re-Evaluation 10/25/21    Authorization Type UHC Medicare    PT Start Time 1022    PT Stop Time 1108    PT Time Calculation (min) 46 min    Activity Tolerance Patient tolerated treatment well    Behavior During Therapy [facility] for tasks assessed/performed             Past Medical History:  Diagnosis Date   Diabetes mellitus without complication (HCC)    Hypertension     History reviewed. No pertinent surgical history.  There were no vitals filed for this visit.   Subjective Assessment - 09/14/21 1024     Subjective Patient reports she feels better today. Able to wash her car yesterday.    Patient Stated Goals "I would like some pointers on exercises and activities I can do for my back"    Currently in Pain? No/denies                               [facility] Adult PT Treatment/Exercise - 09/14/21 0001       Lumbar Exercises: Stretches   Lobbyist Right;Left;2 reps;30 seconds    Quad Stretch Limitations prone with strap    Piriformis Stretch Right;Left;2 reps;30 seconds    Piriformis Stretch Limitations supine KTOS   pt noting good stretch   Figure 4 Stretch 2 reps;30 seconds;Supine;With overpressure      Lumbar Exercises: Aerobic   Nustep L5 x 3 min; L 3 x 2 min   decreased to level 3 as pt started to fatigue     Lumbar Exercises: Standing   Heel Raises 10 reps    Heel Raises Limitations cues to go straight up vs rocking fwd onto toes    Other Standing Lumbar Exercises hip ABD x 10; then x 10 with red band on  thighs; ext x 10 bil RTB, hip flex knee straight RTB at ankles x 10 ea      Lumbar Exercises: Seated   Sit to Stand 10 reps    Other Seated Lumbar Exercises seated heel raises 5 sec hold x 5      Lumbar Exercises: Supine   Ab Set 10 reps;5 seconds    Pelvic Tilt 10 reps;5 seconds    Pelvic Tilt Limitations PPT    Clam 10 reps    Bent Knee Raise 10 reps    Bent Knee Raise Limitations each    Bridge 10 reps                     PT Education - 09/14/21 1108     Education Details HEP progressed    Person(s) Educated Patient    Methods Explanation;Demonstration;Handout    Comprehension Verbalized understanding;Returned demonstration              PT Short Term Goals - 09/13/21 1108       PT SHORT TERM GOAL #1   Title Patient will be independent with  initial HEP    Status New    Target Date 10/04/21               PT Long Term Goals - 09/13/21 1108       PT LONG TERM GOAL #1   Title Patient will be independent with ongoing/advanced HEP for self-management at home    Status New    Target Date 10/25/21      PT LONG TERM GOAL #2   Title Patient to demonstrate ability to achieve and maintain good spinal alignment/posturing and body mechanics needed for daily activities    Status New    Target Date 10/25/21      PT LONG TERM GOAL #3   Title Patient to demonstrate improved tissue quality and pliability as noted by reduced tissue tightness and tenderness to palpation    Status New    Target Date 10/25/21      PT LONG TERM GOAL #4   Title Patient will demonstrate improved B LE strength to >/= 4 to 4+/5 for improved stability and ease of mobility    Status New    Target Date 10/25/21      PT LONG TERM GOAL #5   Title Patient to report ability to perform ADLs, household, and work-related tasks without increased pain    Status New    Target Date 10/25/21                   Plan - 09/14/21 1114     Clinical Impression Statement Kimberly Simmons did very  well with TE today with no complaints of pain. She is independent with her initial HEP and we progressed this with bridging and seated heel raises today.    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, bladder infections    PT Treatment/Interventions ADLs/Self Care Home Management;Cryotherapy;Electrical Stimulation;Iontophoresis 4mg /ml Dexamethasone;Moist Heat;Traction;Ultrasound;Functional mobility training;Therapeutic activities;Therapeutic exercise;Balance training;Neuromuscular re-education;Patient/family education;Manual techniques;Passive range of motion;Dry needling;Taping;Spinal Manipulations    PT Next Visit Plan Review initial HEP and progress lumbopelvic flexibilty and strengthening (no directional preference noted on eval); posture and body mechanics education    PT Home Exercise Plan Access Code: [email or web address] (1/11)             Patient will benefit from skilled therapeutic intervention in order to improve the following deficits and impairments:  Decreased knowledge of precautions, Decreased strength, Increased fascial restricitons, Increased muscle spasms, Impaired perceived functional ability, Impaired flexibility, Improper body mechanics, Postural dysfunction, Pain  Visit Diagnosis: Chronic bilateral low back pain with bilateral sciatica  Radiculopathy, lumbar region  Muscle weakness (generalized)     Problem List There are no problems to display for this patient.   11-22-1997, PT 09/14/2021, 11:18 AM  [facility] [address]  [address] [place], [place], [place] Phone: [phone]   Fax:  [phone]  Name: Kimberly Simmons MRN: Kimberly Simmons Date of Birth: [birth year]

## 2021-09-20 ENCOUNTER — Encounter: Payer: Medicare Other | Admitting: Physical Therapy

## 2021-09-26 ENCOUNTER — Ambulatory Visit: Payer: Medicare Other | Admitting: Physical Therapy

## 2021-09-28 ENCOUNTER — Other Ambulatory Visit: Payer: Self-pay

## 2021-09-28 ENCOUNTER — Ambulatory Visit: Payer: Medicare Other | Admitting: Physical Therapy

## 2021-09-28 ENCOUNTER — Encounter: Payer: Self-pay | Admitting: Physical Therapy

## 2021-09-28 DIAGNOSIS — M5442 Lumbago with sciatica, left side: Secondary | ICD-10-CM | POA: Diagnosis not present

## 2021-09-28 DIAGNOSIS — M5416 Radiculopathy, lumbar region: Secondary | ICD-10-CM

## 2021-09-28 DIAGNOSIS — M6281 Muscle weakness (generalized): Secondary | ICD-10-CM

## 2021-09-28 DIAGNOSIS — G8929 Other chronic pain: Secondary | ICD-10-CM

## 2021-09-28 NOTE — Therapy (Signed)
[facility] [address]  [address] [place], [place], [address] Phone: [phone]   Fax:  [phone]  Physical Therapy Treatment  Patient Details  Name: Kimberly Simmons MRN: [id number] Date of Birth: 1941-08-27 Referring Provider (PT): Eula Listen, MD   Encounter Date: 09/28/2021   PT End of Session - 09/28/21 1017     Visit Number 3    Number of Visits 12    Date for PT Re-Evaluation 10/25/21    Authorization Type UHC Medicare    PT Start Time 1017    PT Stop Time 1059    PT Time Calculation (min) 42 min    Activity Tolerance Patient tolerated treatment well    Behavior During Therapy [facility] for tasks assessed/performed             Past Medical History:  Diagnosis Date   Diabetes mellitus without complication (HCC)    Hypertension     History reviewed. No pertinent surgical history.  There were no vitals filed for this visit.   Subjective Assessment - 09/28/21 1020     Subjective Pt reports her back doesn't hurt as much as it did but she has been having more issues with L ankle pain and stiffness. L ankle pain typically bothers when she goes to get up after a period of inactivity - pain up to 8/10 initially but will subside as she gets moving.    Patient Stated Goals "I would like some pointers on exercises and activities I can do for my back"    Currently in Pain? No/denies                               [facility] Adult PT Treatment/Exercise - 09/28/21 1017       Exercises   Exercises Lumbar      Lumbar Exercises: Stretches   Gastroc Stretch Left;Right;2 reps;30 seconds    Gastroc Stretch Limitations standing runner's gastroc & soleus stretches      Lumbar Exercises: Aerobic   Nustep L3 x 6 min (UE/LE)      Lumbar Exercises: Seated   Hip Flexion on Ball Both;10 reps    Hip Flexion on Ball Limitations TrA + seated march (no ball)    Sit to Stand 5 reps    Sit to Stand  Limitations + red TB hip ABD isometric    Other Seated Lumbar Exercises seated heel raises 25 x 5 sec    Other Seated Lumbar Exercises TrA isometric 10 x 5 sec      Lumbar Exercises: Supine   Clam 10 reps;3 seconds    Clam Limitations TrA + red TB alt bent-knee fallout    Bent Knee Raise 10 reps;3 seconds    Bent Knee Raise Limitations red TB brace march    Bridge 10 reps;5 seconds    Bridge Limitations cues to put pressure through flat foot rather than heels to reduce HS cramping    Bridge with clamshell 10 reps;5 seconds    Bridge with [employer] Limitations + red TB hip ADB isometric      Lumbar Exercises: Sidelying   Clam Right;Left;10 reps;3 seconds    Clam Limitations red TB at knees                     PT Education - 09/28/21 1059     Education Details HEP progression - Access Code: [email or web address]  Person(s) Educated Patient    Methods Explanation;Demonstration;Verbal cues;Tactile cues;Handout    Comprehension Verbalized understanding;Verbal cues required;Tactile cues required;Returned demonstration;Need further instruction              PT Short Term Goals - 09/28/21 1023       PT SHORT TERM GOAL #1   Title Patient will be independent with initial HEP    Status Achieved   09/28/21              PT Long Term Goals - 09/28/21 1023       PT LONG TERM GOAL #1   Title Patient will be independent with ongoing/advanced HEP for self-management at home    Status On-going    Target Date 10/25/21      PT LONG TERM GOAL #2   Title Patient to demonstrate ability to achieve and maintain good spinal alignment/posturing and body mechanics needed for daily activities    Status On-going    Target Date 10/25/21      PT LONG TERM GOAL #3   Title Patient to demonstrate improved tissue quality and pliability as noted by reduced tissue tightness and tenderness to palpation    Status On-going    Target Date 10/25/21      PT LONG TERM GOAL #4   Title Patient  will demonstrate improved B LE strength to >/= 4 to 4+/5 for improved stability and ease of mobility    Status On-going    Target Date 10/25/21      PT LONG TERM GOAL #5   Title Patient to report ability to perform ADLs, household, and work-related tasks without increased pain    Status On-going    Target Date 10/25/21                   Plan - 09/28/21 1023     Clinical Impression Statement Kimberly Simmons reports her back pain has been improving but she is having more problems with L ankle pain and stiffness, especially upon initiation of standing/walking after periods of inactivity. She does not feel that this is related to the heel raises recently added to her HEP but does note some tightness in the tendons at the back of her L ankle, therefore added gastroc and soleus stretches with some relief noted and better tolerance for seated heel raises (less cramping in calf). Progressed core/lumbopelvic strengthening with addition of red TB with abdominal and proximal LE strengthening with good tolerance. HEP updated with calf stretches and strengthening progression.    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, bladder infections    Rehab Potential Good    PT Frequency 2x / week    PT Duration 6 weeks   4-6 weeks   PT Treatment/Interventions ADLs/Self Care Home Management;Cryotherapy;Electrical Stimulation;Iontophoresis 4mg /ml Dexamethasone;Moist Heat;Traction;Ultrasound;Functional mobility training;Therapeutic activities;Therapeutic exercise;Balance training;Neuromuscular re-education;Patient/family education;Manual techniques;Passive range of motion;Dry needling;Taping;Spinal Manipulations    PT Next Visit Plan progress lumbopelvic flexibilty and strengthening (no directional preference noted on eval); posture and body mechanics education    PT Home Exercise Plan Access Code: [email or web address] (1/11, updated 1/12)             Patient will benefit from skilled  therapeutic intervention in order to improve the following deficits and impairments:  Decreased knowledge of precautions, Decreased strength, Increased fascial restricitons, Increased muscle spasms, Impaired perceived functional ability, Impaired flexibility, Improper body mechanics, Postural dysfunction, Pain  Visit Diagnosis: Chronic bilateral low back pain with bilateral sciatica  Radiculopathy, lumbar region  Muscle  weakness (generalized)     Problem List There are no problems to display for this patient.   Kimberly Simmons, PT 09/28/2021, 11:06 AM  [facility] [place] [address]  [address] [place], [place], [address] Phone: [phone]   Fax:  [phone]  Name: Kimberly Simmons MRN: [id number] Date of Birth: 08-14-1941

## 2021-09-28 NOTE — Patient Instructions (Signed)
° ° ° °  Access Code: [email or web address] URL: [email or web address] Date: 09/28/2021 Prepared by: Glenetta Hew  Exercises Supine Figure 4 Piriformis Stretch - 2-3 x daily - 7 x weekly - 3 reps - 30 sec hold Supine Piriformis Stretch with Foot on Ground - 2-3 x daily - 7 x weekly - 3 reps - 30 sec hold Supine Posterior Pelvic Tilt - 1 x daily - 7 x weekly - 2 sets - 10 reps - 5 sec hold Seated Heel Raise - 2 x daily - 7 x weekly - 1 sets - 5-10 reps - 5 sec hold Standing Gastroc Stretch - 2-3 x daily - 7 x weekly - 3 reps - 30 sec hold Standing Soleus Stretch - 2-3 x daily - 7 x weekly - 3 reps - 30 sec hold Hooklying Isometric Clamshell - 1 x daily - 4 x weekly - 2 sets - 10 reps - 3 sec hold Supine March with Resistance Band - 1 x daily - 4 x weekly - 2 sets - 10 reps - 2-3 sec hold hold Bridge with Resistance - 1 x daily - 4 x weekly - 2 sets - 10 reps - 5 sec hold

## 2021-10-03 ENCOUNTER — Encounter: Payer: Self-pay | Admitting: Physical Therapy

## 2021-10-03 ENCOUNTER — Other Ambulatory Visit: Payer: Self-pay

## 2021-10-03 ENCOUNTER — Ambulatory Visit: Payer: Medicare Other | Admitting: Physical Therapy

## 2021-10-03 DIAGNOSIS — M5416 Radiculopathy, lumbar region: Secondary | ICD-10-CM

## 2021-10-03 DIAGNOSIS — M5441 Lumbago with sciatica, right side: Secondary | ICD-10-CM

## 2021-10-03 DIAGNOSIS — M6281 Muscle weakness (generalized): Secondary | ICD-10-CM

## 2021-10-03 DIAGNOSIS — G8929 Other chronic pain: Secondary | ICD-10-CM

## 2021-10-03 DIAGNOSIS — M5442 Lumbago with sciatica, left side: Secondary | ICD-10-CM | POA: Diagnosis not present
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
## 2021-10-05 ENCOUNTER — Encounter (INDEPENDENT_AMBULATORY_CARE_PROVIDER_SITE_OTHER): Payer: Medicare Other | Admitting: Ophthalmology

## 2021-10-05 ENCOUNTER — Other Ambulatory Visit: Payer: Self-pay

## 2021-10-05 ENCOUNTER — Encounter: Payer: Medicare Other | Admitting: Physical Therapy

## 2021-10-05 DIAGNOSIS — H35033 Hypertensive retinopathy, bilateral: Secondary | ICD-10-CM | POA: Diagnosis not present

## 2021-10-05 DIAGNOSIS — H34812 Central retinal vein occlusion, left eye, with macular edema: Secondary | ICD-10-CM

## 2021-10-05 DIAGNOSIS — I10 Essential (primary) hypertension: Secondary | ICD-10-CM

## 2021-10-05 DIAGNOSIS — H43813 Vitreous degeneration, bilateral: Secondary | ICD-10-CM
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
## 2021-10-09 ENCOUNTER — Encounter: Payer: Self-pay | Admitting: Internal Medicine

## 2021-10-09 ENCOUNTER — Ambulatory Visit (INDEPENDENT_AMBULATORY_CARE_PROVIDER_SITE_OTHER): Payer: Medicare Other | Admitting: Internal Medicine

## 2021-10-09 ENCOUNTER — Other Ambulatory Visit: Payer: Self-pay

## 2021-10-09 VITALS — BP 161/77 | HR 62 | Resp 16 | Ht 59.0 in | Wt 170.0 lb

## 2021-10-09 DIAGNOSIS — M5441 Lumbago with sciatica, right side: Secondary | ICD-10-CM

## 2021-10-09 DIAGNOSIS — M159 Polyosteoarthritis, unspecified: Secondary | ICD-10-CM

## 2021-10-09 DIAGNOSIS — M25572 Pain in left ankle and joints of left foot: Secondary | ICD-10-CM | POA: Diagnosis not present

## 2021-10-09 DIAGNOSIS — G8929 Other chronic pain: Secondary | ICD-10-CM

## 2021-10-09 DIAGNOSIS — M5442 Lumbago with sciatica, left side: Secondary | ICD-10-CM | POA: Diagnosis not present

## 2021-10-09 NOTE — Patient Instructions (Addendum)
I am referring you to see podiatry about the left ankle and foot pain if there are additional options to improve symptoms without more daily medications or surgeries.  I do not see evidence of rheumatoid arthritis. You have osteoarthritis in multiple areas which is most likely age related.  For osteoarthritis several treatments may be beneficial: - Topical antiinflammatory medicine such as diclofenac or Voltaren can be applied to  affected area as needed but may be less effective than oral antiinflammatory medicine. Topical analgesics containing CBD, menthol, or lidocaine can be tried.  - Oral nonsteroidal antiinflammatory medication such as ibuprofen, aleve, celebrex, or mobic are very helpful for osteoarthritis but can cause side effects such as stomach ulcers or hypertension with prolonged use. These should be taken intermittently or as needed, and always taken with food.  - Other oral supplements such as glucosamine chondroitin containing OTC treatments do not have strong data supporting effectiveness but can be helpful for some individuals and have no major side effects. Turmeric has some antiinflammatory effect similar to NSAID medications and may help, if taken as a supplement should not be taken above recommended doses.  - Compressive gloves can be helpful to support the thumb joint especially if hurting with certain activities.  - Occupational therapy referral can discuss exercises or activity modification to improve symptoms or strength if needed.  - Local steroid injection is an option if symptoms become worse and not controlled by the above options.

## 2021-10-10 ENCOUNTER — Emergency Department (HOSPITAL_BASED_OUTPATIENT_CLINIC_OR_DEPARTMENT_OTHER)
Admission: EM | Admit: 2021-10-10 | Discharge: 2021-10-10 | Disposition: A | Discharge status: Home / Self Care | Payer: No Typology Code available for payment source | Attending: Emergency Medicine | Admitting: Emergency Medicine

## 2021-10-10 ENCOUNTER — Ambulatory Visit: Payer: Medicare Other | Admitting: Physical Therapy

## 2021-10-10 ENCOUNTER — Other Ambulatory Visit: Payer: Self-pay

## 2021-10-10 DIAGNOSIS — Z041 Encounter for examination and observation following transport accident: Secondary | ICD-10-CM | POA: Insufficient documentation

## 2021-10-10 DIAGNOSIS — Z79899 Other long term (current) drug therapy: Secondary | ICD-10-CM | POA: Insufficient documentation

## 2021-10-10 DIAGNOSIS — Z7982 Long term (current) use of aspirin: Secondary | ICD-10-CM | POA: Insufficient documentation

## 2021-10-10 DIAGNOSIS — M1990 Unspecified osteoarthritis, unspecified site: Secondary | ICD-10-CM

## 2021-10-10 NOTE — ED Notes (Signed)
EMT-P provided AVS using Teachback Method. Patient verbalizes understanding of Discharge Instructions. Opportunity for Questioning and Answers were provided by EMT-P. Patient Discharged from ED.  ? ?
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
## 2021-10-30 ENCOUNTER — Ambulatory Visit: Payer: Medicare Other | Admitting: Internal Medicine

## 2021-10-31 ENCOUNTER — Ambulatory Visit (INDEPENDENT_AMBULATORY_CARE_PROVIDER_SITE_OTHER): Payer: Medicare Other | Admitting: Podiatry

## 2021-10-31 ENCOUNTER — Ambulatory Visit (INDEPENDENT_AMBULATORY_CARE_PROVIDER_SITE_OTHER): Payer: Medicare Other

## 2021-10-31 ENCOUNTER — Other Ambulatory Visit: Payer: Self-pay

## 2021-10-31 DIAGNOSIS — M2142 Flat foot [pes planus] (acquired), left foot: Secondary | ICD-10-CM

## 2021-10-31 DIAGNOSIS — M19072 Primary osteoarthritis, left ankle and foot: Secondary | ICD-10-CM

## 2021-10-31 NOTE — Progress Notes (Signed)
°  Subjective:  Patient ID: Kimberly Simmons, female    DOB: Jan 18, 1941,  MRN: [id number]  Chief Complaint  Patient presents with   Foot Pain    (np)  Pain in left ankle and joints of left foot    81 y.o. female presents with the above complaint. History confirmed with patient.  She is a very flatfoot and chronic left ankle pain.  This worsened after Thanksgiving.  She did see rheumatologist and told her this was likely osteoarthritis and not rheumatoid.  She has very flatfoot on the left side, the right side had a history of surgery to correct this and is not giving her much trouble at all.  She does wear orthotics currently.  Objective:  Physical Exam: warm, good capillary refill, no trophic changes or ulcerative lesions, normal DP and PT pulses, normal sensory exam, and left foot severe planus planovalgus deformity with collapse of the medial arch she has pain on the sinus tarsi and subfibular pain as well as over the calcaneocuboid joint.   Radiographs: Multiple views x-ray of the left foot: Severe pes planovalgus deformity with arthrosis of the calcaneocuboid joint subtalar joint and talonavicular joint Assessment:   1. Pes planovalgus, acquired, left   2. Osteoarthritis of left ankle and foot      Plan:  Patient was evaluated and treated and all questions answered.  I reviewed the radiographs with her in detail.  We discussed etiology and treatment options.  Discussed surgical and nonoperative treatment as well.  I think surgery at her age would likely require double or triple arthrodesis and would be quite an undertaking in quite a bit of recovery and would carry a significant amount of risk which I discussed with her.  I recommended supporting her foot as best as possible, her current orthoses are well molded but are fairly flexible in nature and I think she needs something more supportive.  I would recommend a [place] mezzo brace that would support the subtalar joint and reduce  hindfoot motion, I will have her scheduled with our orthotist for this.  Her most painful area clinically and radiographically today appears to be consistent with the calcaneocuboid joint.  I recommended corticosteroid injection to alleviate pain and inflammation.  I discussed with her this would likely not fix or treat the arthritis but would rather offer pain relief until we can get her support with her brace.  Following sterile prep with Betadine 0.5 cc of 2% lidocaine plain, 2 mg of dexamethasone phosphate and 10 mg of Kenalog were injected directly into the calcaneocuboid joint just anterior and inferior to the anterior calcaneal process.  She tolerated the procedure well.  Return in about 3 months (around 01/28/2022).

## 2021-11-02 ENCOUNTER — Other Ambulatory Visit: Payer: Self-pay

## 2021-11-02 ENCOUNTER — Encounter (INDEPENDENT_AMBULATORY_CARE_PROVIDER_SITE_OTHER): Payer: Medicare Other | Admitting: Ophthalmology

## 2021-11-02 DIAGNOSIS — H34812 Central retinal vein occlusion, left eye, with macular edema: Secondary | ICD-10-CM

## 2021-11-02 DIAGNOSIS — H2511 Age-related nuclear cataract, right eye: Secondary | ICD-10-CM

## 2021-11-02 DIAGNOSIS — I10 Essential (primary) hypertension: Secondary | ICD-10-CM | POA: Diagnosis not present

## 2021-11-02 DIAGNOSIS — H35033 Hypertensive retinopathy, bilateral: Secondary | ICD-10-CM | POA: Diagnosis not present

## 2021-11-02 DIAGNOSIS — H43813 Vitreous degeneration, bilateral: Secondary | ICD-10-CM

## 2021-11-07 ENCOUNTER — Other Ambulatory Visit: Payer: Self-pay

## 2021-11-07 ENCOUNTER — Ambulatory Visit: Payer: Medicare Other

## 2021-11-07 DIAGNOSIS — M19072 Primary osteoarthritis, left ankle and foot: Secondary | ICD-10-CM

## 2021-11-07 DIAGNOSIS — M2142 Flat foot [pes planus] (acquired), left foot: Secondary | ICD-10-CM

## 2021-11-07 NOTE — Progress Notes (Signed)
SITUATION ?Patient Name:  Kimberly Simmons ?MRN:   [id number] ?Reason for Visit: Evaluation for [facility] ? ?Patient Report: ?Chief Complaint:   Osteoarthritis of left ankle ?Nature of Discomfort/Pain:  Ambulatory Standing ?Location:    left lower extremity ?Onset & Duration:   Gradual and Present longer than 3 months ?Course:    gradually worsening ?Aggravating or Alleviating Factors: Ambulation ? ?OBJECTIVE DATA & MEASUREMENTS ?Prognosis:    Good ?Duration of use:   5 years ? ?Diagnosis: ?  ICD-10-CM   ?1. Pes planovalgus, acquired, left  M21.42   ?  ?2. Osteoarthritis of left ankle and foot  M19.072   ?  ? ? ?GOALS, NECESSITIES, & JUSTIFICATIONS ?Recommended Device: [employer] ?Color:    Black ?Closure:   Laces ? ?Laterality HCPCS Code Description Justification  ?left [id number] Plastic orthosis, supramalleolar, custom molded from a model of the patient, custom fabricated, includes casting and cast preparation. Necessary to provide triplanar support to the foot/ankle complex  ?left L2330 Addition to lower extremity, lacer molded to patient model Necessary to ensure secure hold of orthosis to patient's limb  ? ? ?I certify that Arnold Long qualifies for and will benefit from an ankle foot orthosis used during ambulation based on meeting all of the following criteria;  ? ?The patient is: ?- Ambulatory, and ?- Has weakness or deformity of the foot and ankle, and ?- Requires stabilization for medical reasons, and ?- Has the potential to benefit functionally ? ?The patient?s medical record contains sufficient documentation of the patients medical condition to substantiate the necessity for the type and quantity of the items ordered. ? ?The goals of this therapy: ?- Improve Mobility ?- Improve Lower Extremity Stability ?- Decrease Pain ?- Facilitate Soft Tissue Healing ?- Facilitate Immobilization, healing and treatment of an injury ? ?Necessity of Ankle Foot Orthotic molded to patient model: ?A custom (vs.  prefabricated) ankle foot orthosis has been prescribed based on the following criteria which are specific to the condition of this patient; ?- The patient could not be fit with a prefabricated AFO ?- The condition necessitating the orthosis is expected to be permanent or of longstanding duration (more than 6 months) ?- There is need to control the ankle or foot in more than one plane ?- The patient has a documented neurological, circulatory, or orthopedic condition that requires custom fabrication over a model to prevent tissue injury ?- The patient has a healing fracture that lacks normal anatomical integrity or anthropometric proportions ? ?I hereby certify that the ankle foot orthotic described above is a rigid or semi-rigid device which is used for the purpose of supporting a weak or deformed body member or restricting or eliminating motion in a diseased or injured part of the body. It is designed to provide support and counterforce on the limb or body part that is being braced. In my opinion, the custom molded ankle foot orthosis is both reasonable and necessary in reference to accepted standards of medical practice in the treatment  ?of the patient condition and rehabilitation. ? ?ACTIONS PERFORMED ?Patient was evaluated and casted for [facility] via [employer]. Procedure was explained to patient. Patient tolerated procedure. patient selected device color and closure method.  ? ?PLAN ?Patient to return in four to six weeks for fitting and delivery of device. Plan of care was explained to and agreed upon by patient. All questions were answered and concerns addressed. ? ? ? ? ? ?

## 2021-11-30 ENCOUNTER — Encounter (INDEPENDENT_AMBULATORY_CARE_PROVIDER_SITE_OTHER): Payer: Medicare Other | Admitting: Ophthalmology

## 2021-12-01 ENCOUNTER — Encounter (INDEPENDENT_AMBULATORY_CARE_PROVIDER_SITE_OTHER): Payer: Medicare Other | Admitting: Ophthalmology

## 2021-12-01 DIAGNOSIS — H2511 Age-related nuclear cataract, right eye: Secondary | ICD-10-CM

## 2021-12-01 DIAGNOSIS — H35033 Hypertensive retinopathy, bilateral: Secondary | ICD-10-CM | POA: Diagnosis not present

## 2021-12-01 DIAGNOSIS — H34812 Central retinal vein occlusion, left eye, with macular edema: Secondary | ICD-10-CM

## 2021-12-01 DIAGNOSIS — I10 Essential (primary) hypertension: Secondary | ICD-10-CM

## 2021-12-01 DIAGNOSIS — H43813 Vitreous degeneration, bilateral: Secondary | ICD-10-CM

## 2021-12-18 IMAGING — CT CT HEAD W/O CM
3 series · 14 of 47 positions shown, 16 images · non-contrast
Comparison: None.

CLINICAL DATA: Initial evaluation for acute dizziness, ataxia.

EXAM:
CT HEAD WITHOUT CONTRAST
TECHNIQUE: Contiguous axial images were obtained from the base of the skull
through the vertex without intravenous contrast.

[Series 2: head wo · axial · 0.44mm/px · z∈[+817,+947]mm · 8 of 32 slices shown, 10 images]
[im 3/32  brain]
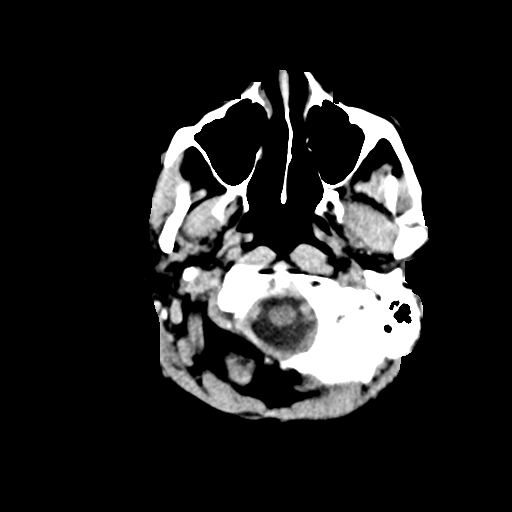
[im 3/32  bone]
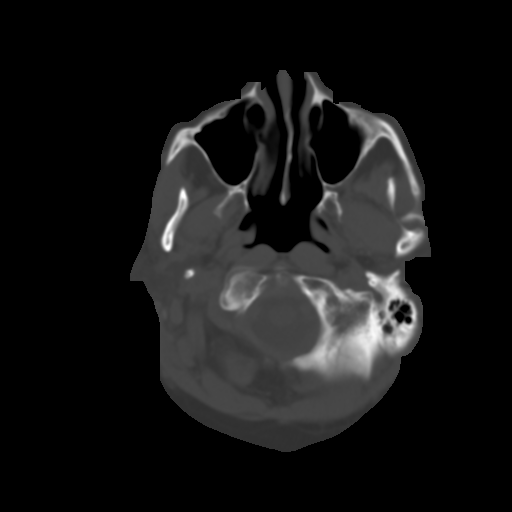
[im 7/32  brain]
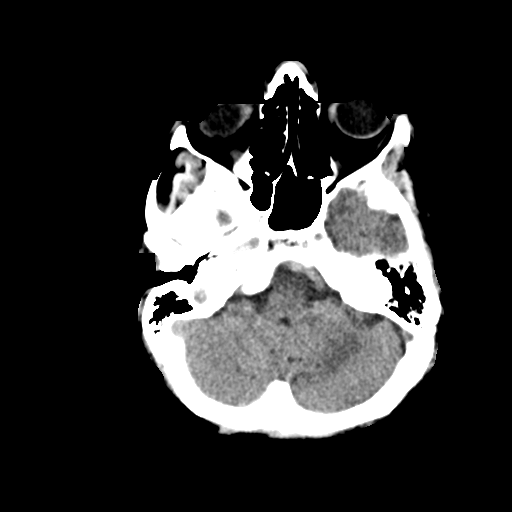
[im 10/32  brain]
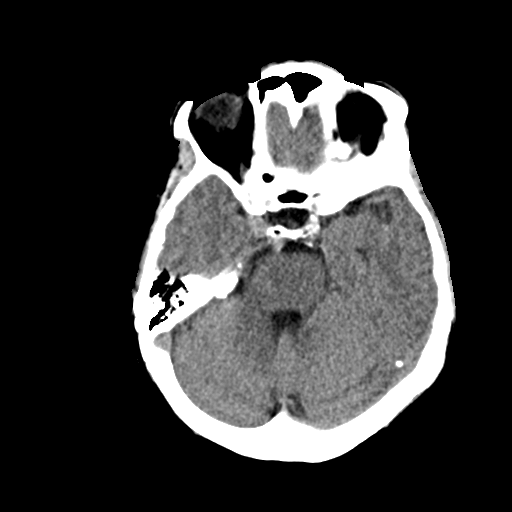
[im 14/32  brain]
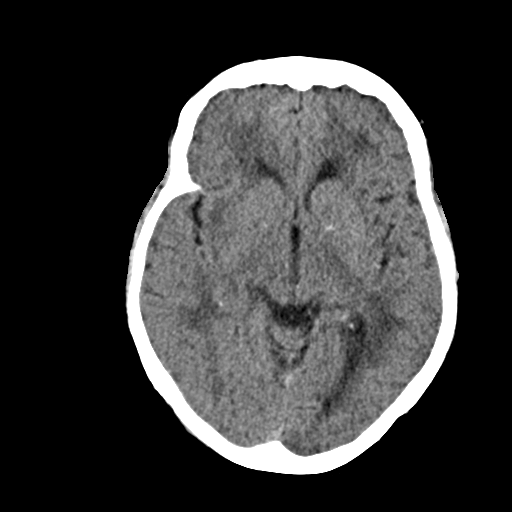
[im 18/32  brain]
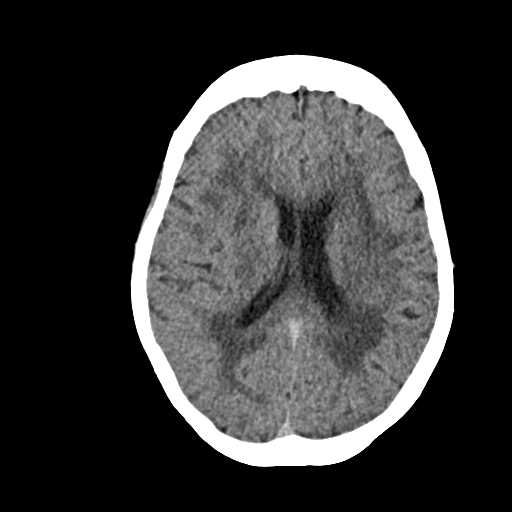
[im 18/32  bone]
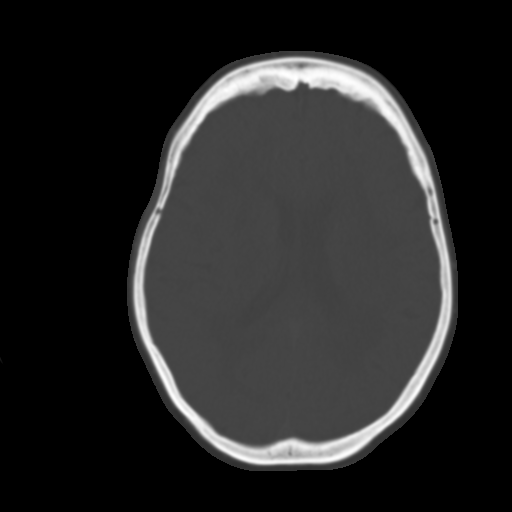
[im 22/32  brain]
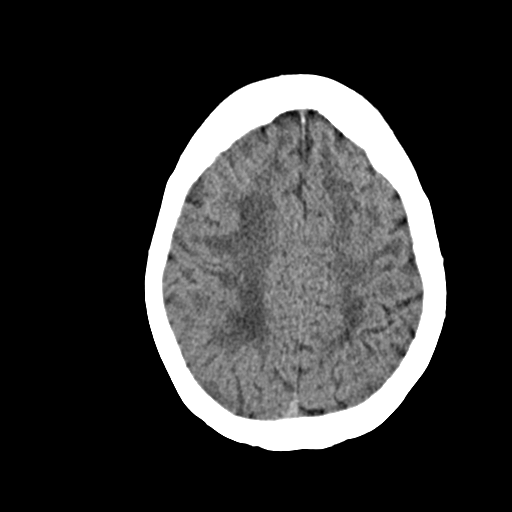
[im 25/32  brain]
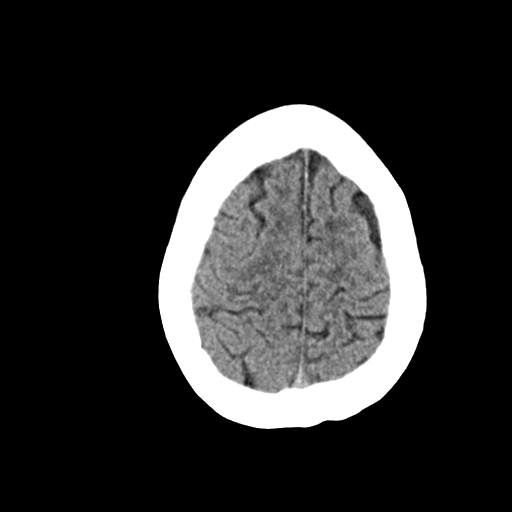
[im 29/32  brain]
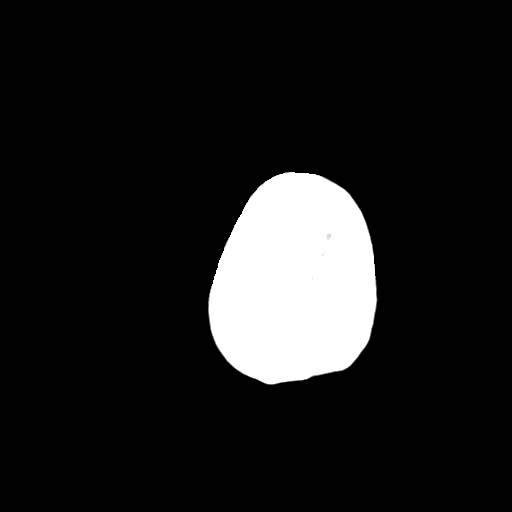

[Series 4: coronal soft · coronal · 0.32mm/px · 3 of 68 slices shown]
[im 23/68  brain]
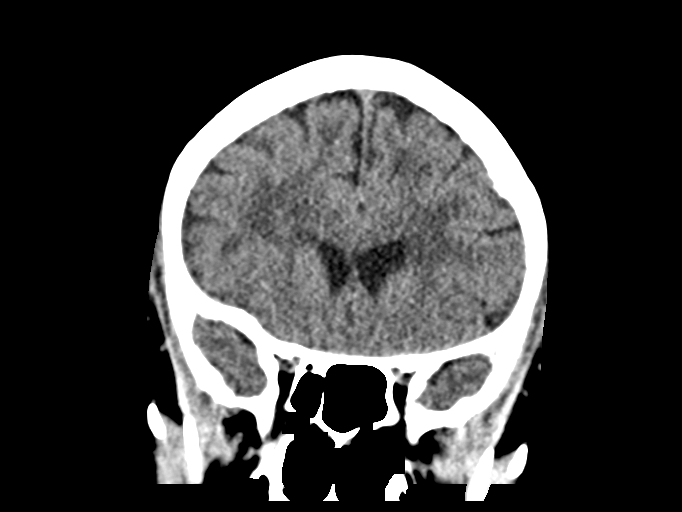
[im 30/68  brain]
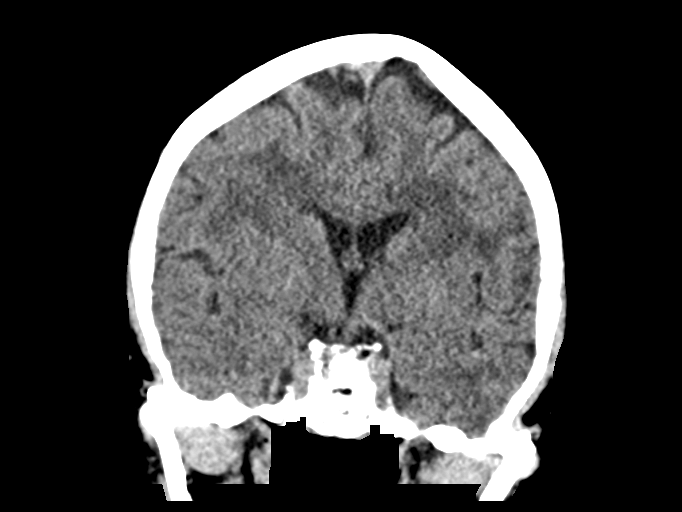
[im 38/68  brain]
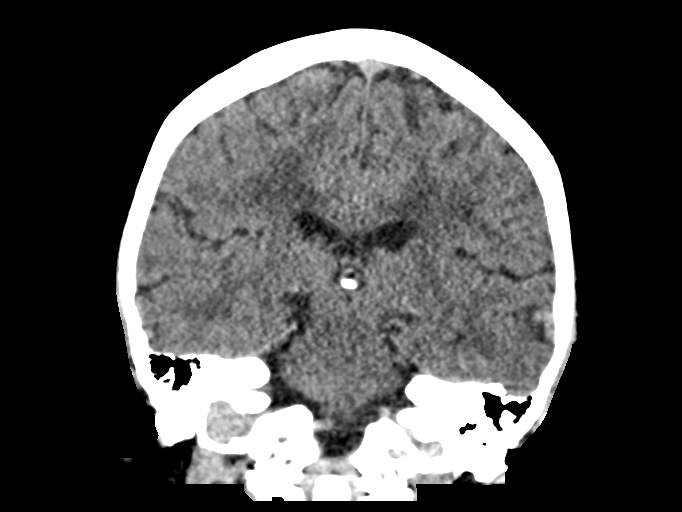

[Series 5: sag soft · sagittal · 0.31mm/px · 3 of 57 slices shown]
[im 19/57  brain]
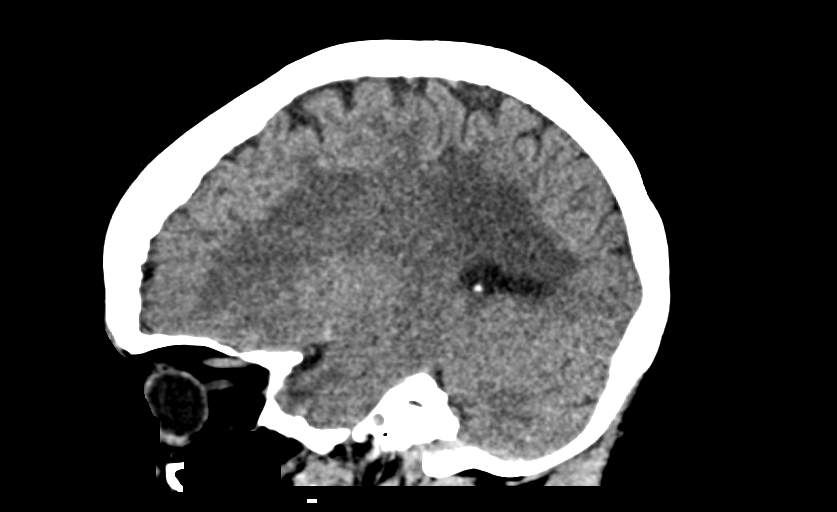
[im 29/57  brain]
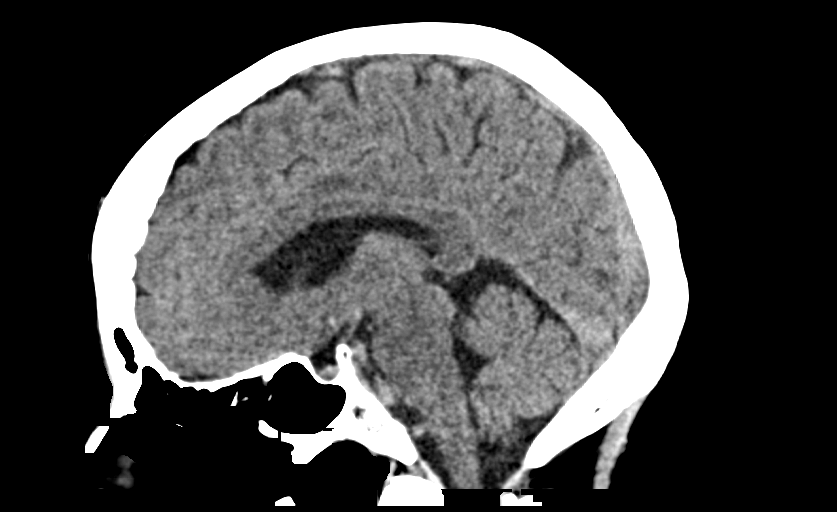
[im 38/57  brain]
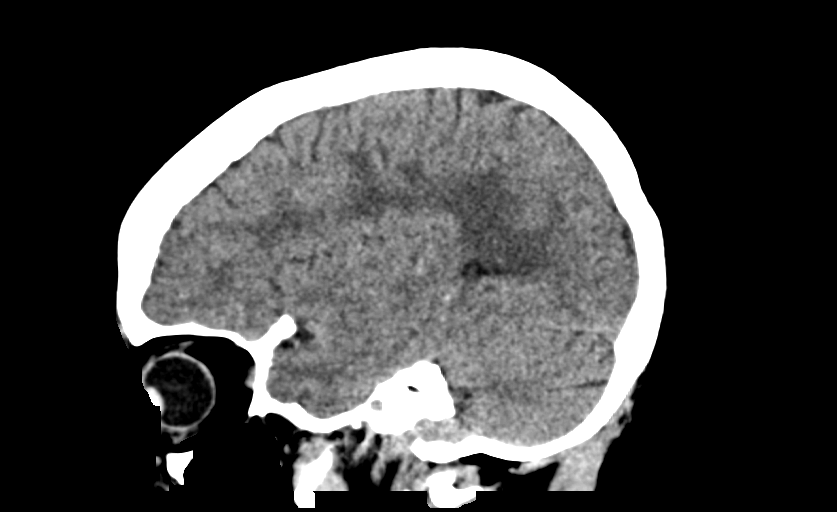

[14 of 47 positions shown; findings below may reference images not displayed]

FINDINGS: Brain: Mild age-related cerebral atrophy. Patchy and confluent
hypodensity involving the periventricular deep white matter both
cerebral hemispheres most likely related chronic microvascular
ischemic disease, moderate in nature. No acute intracranial
hemorrhage. No acute large vessel territory infarct. No mass lesion,
midline shift or mass effect. No hydrocephalus. No extra-axial fluid
collection.

Vascular: No hyperdense vessel. Scattered vascular calcifications
noted within the carotid siphons.

Skull: Scalp soft tissues and calvarium within normal limits.

Sinuses/Orbits: Globes and orbital soft tissues demonstrate no acute
finding. Patient status post ocular lens replacement on the left.
Paranasal sinuses and mastoid air cells are clear.

Other: None.
IMPRESSION: 1. No acute intracranial abnormality.
2. Moderate chronic microvascular ischemic disease.

## 2021-12-29 ENCOUNTER — Encounter (INDEPENDENT_AMBULATORY_CARE_PROVIDER_SITE_OTHER): Payer: Medicare Other | Admitting: Ophthalmology

## 2021-12-29 DIAGNOSIS — H35033 Hypertensive retinopathy, bilateral: Secondary | ICD-10-CM | POA: Diagnosis not present

## 2021-12-29 DIAGNOSIS — I10 Essential (primary) hypertension: Secondary | ICD-10-CM

## 2021-12-29 DIAGNOSIS — H43813 Vitreous degeneration, bilateral: Secondary | ICD-10-CM | POA: Diagnosis not present

## 2021-12-29 DIAGNOSIS — H34812 Central retinal vein occlusion, left eye, with macular edema: Secondary | ICD-10-CM

## 2022-01-30 ENCOUNTER — Ambulatory Visit (INDEPENDENT_AMBULATORY_CARE_PROVIDER_SITE_OTHER): Payer: Medicare Other | Admitting: Podiatry

## 2022-01-30 DIAGNOSIS — M19072 Primary osteoarthritis, left ankle and foot: Secondary | ICD-10-CM | POA: Diagnosis not present

## 2022-01-30 DIAGNOSIS — M2142 Flat foot [pes planus] (acquired), left foot: Secondary | ICD-10-CM

## 2022-01-30 NOTE — Patient Instructions (Signed)

## 2022-01-31 ENCOUNTER — Encounter (INDEPENDENT_AMBULATORY_CARE_PROVIDER_SITE_OTHER): Payer: Medicare Other | Admitting: Ophthalmology

## 2022-01-31 DIAGNOSIS — H43813 Vitreous degeneration, bilateral: Secondary | ICD-10-CM | POA: Diagnosis not present

## 2022-01-31 DIAGNOSIS — H35033 Hypertensive retinopathy, bilateral: Secondary | ICD-10-CM

## 2022-01-31 DIAGNOSIS — H34812 Central retinal vein occlusion, left eye, with macular edema: Secondary | ICD-10-CM | POA: Diagnosis not present

## 2022-01-31 DIAGNOSIS — I10 Essential (primary) hypertension: Secondary | ICD-10-CM

## 2022-01-31 DIAGNOSIS — H2511 Age-related nuclear cataract, right eye: Secondary | ICD-10-CM

## 2022-01-31 NOTE — Progress Notes (Signed)
  Subjective:  Patient ID: Kimberly Simmons, female    DOB: 11/13/1940,  MRN: [id number]  Chief Complaint  Patient presents with   Foot Pain    71m Pain in left ankle and joints of left foot    81 y.o. female presents with the above complaint. History confirmed with patient.  She is a very flatfoot and chronic left ankle pain.  This worsened after Thanksgiving.  She did see rheumatologist and told her this was likely osteoarthritis and not rheumatoid.  She has very flatfoot on the left side, the right side had a history of surgery to correct this and is not giving her much trouble at all.  She does wear orthotics currently.   Interval history: Since last visit if she is having an improvement in pain on the outside of the ankle where the injection was done this is mostly resolved.  There is persistent pain and newer pain on the inside of the ankle now.  Not as severe as the pain on the outside  Objective:  Physical Exam: warm, good capillary refill, no trophic changes or ulcerative lesions, normal DP and PT pulses, normal sensory exam, and left foot severe planus planovalgus deformity with collapse of the medial arch she has improved pain in the sinus tarsi and subfibular area as well as the calcaneocuboid joint, new pain along the PT tendon no gross instability 5 out of 5 strength   Radiographs: Multiple views x-ray of the left foot: Severe pes planovalgus deformity with arthrosis of the calcaneocuboid joint subtalar joint and talonavicular joint Assessment:   1. Pes planovalgus, acquired, left   2. Osteoarthritis of left ankle and foot      Plan:  Patient was evaluated and treated and all questions answered.  Overall is had some improvement the last injection.  She feels like her pain is not nearly as severe as it was and we will hold off on further injections for now until this worsens.  I do think she will benefit greatly from the [facility] brace that she is fitted for.  Our office  will reach out to the production facility see if it is completed yet and we will let her know when it is ready.  She will turn to see me as needed for injections or further treatment.  No follow-ups on file.

## 2022-02-05 ENCOUNTER — Telehealth: Payer: Self-pay

## 2022-02-05 NOTE — Telephone Encounter (Signed)
FYI - Called pt to inform her that [facility] stated that they would be shipping her brace on 06/7 and we would call her for fitting appt when it is received.

## 2022-02-12 ENCOUNTER — Telehealth: Payer: Self-pay

## 2022-02-12 NOTE — Telephone Encounter (Signed)
LVM for patient to call back and schedule appointment, brace is ready.

## 2022-03-02 ENCOUNTER — Encounter (INDEPENDENT_AMBULATORY_CARE_PROVIDER_SITE_OTHER): Payer: Medicare Other | Admitting: Ophthalmology

## 2022-03-05 ENCOUNTER — Encounter (INDEPENDENT_AMBULATORY_CARE_PROVIDER_SITE_OTHER): Payer: Medicare Other | Admitting: Ophthalmology

## 2022-03-05 DIAGNOSIS — H34812 Central retinal vein occlusion, left eye, with macular edema: Secondary | ICD-10-CM

## 2022-03-05 DIAGNOSIS — H43813 Vitreous degeneration, bilateral: Secondary | ICD-10-CM | POA: Diagnosis not present

## 2022-03-05 DIAGNOSIS — H35033 Hypertensive retinopathy, bilateral: Secondary | ICD-10-CM | POA: Diagnosis not present

## 2022-03-05 DIAGNOSIS — I10 Essential (primary) hypertension: Secondary | ICD-10-CM | POA: Diagnosis not present

## 2022-04-04 ENCOUNTER — Ambulatory Visit: Payer: Medicare Other

## 2022-04-04 DIAGNOSIS — M2142 Flat foot [pes planus] (acquired), left foot: Secondary | ICD-10-CM

## 2022-04-04 DIAGNOSIS — E1142 Type 2 diabetes mellitus with diabetic polyneuropathy: Secondary | ICD-10-CM

## 2022-04-04 DIAGNOSIS — M2141 Flat foot [pes planus] (acquired), right foot: Secondary | ICD-10-CM

## 2022-04-04 NOTE — Progress Notes (Signed)
Patient in office to pick-up AFO brace.   Brace does not fit in shoe properly and we are unable to make adjustments at this time. We will request provider to write a Rx for patient to go [facility].   Dr. Lilian Kapur has been informed.

## 2022-04-10 ENCOUNTER — Encounter (INDEPENDENT_AMBULATORY_CARE_PROVIDER_SITE_OTHER): Payer: Medicare Other | Admitting: Ophthalmology

## 2022-04-10 DIAGNOSIS — H35033 Hypertensive retinopathy, bilateral: Secondary | ICD-10-CM | POA: Diagnosis not present

## 2022-04-10 DIAGNOSIS — H34812 Central retinal vein occlusion, left eye, with macular edema: Secondary | ICD-10-CM

## 2022-04-10 DIAGNOSIS — I10 Essential (primary) hypertension: Secondary | ICD-10-CM | POA: Diagnosis not present

## 2022-04-10 DIAGNOSIS — H43813 Vitreous degeneration, bilateral: Secondary | ICD-10-CM | POA: Diagnosis not present

## 2022-04-24 ENCOUNTER — Encounter: Payer: Self-pay | Admitting: Podiatry

## 2022-04-24 ENCOUNTER — Ambulatory Visit (INDEPENDENT_AMBULATORY_CARE_PROVIDER_SITE_OTHER): Payer: Medicare Other | Admitting: Podiatry

## 2022-04-24 DIAGNOSIS — M79675 Pain in left toe(s): Secondary | ICD-10-CM | POA: Diagnosis not present

## 2022-04-24 DIAGNOSIS — B351 Tinea unguium: Secondary | ICD-10-CM

## 2022-04-24 DIAGNOSIS — E1142 Type 2 diabetes mellitus with diabetic polyneuropathy: Secondary | ICD-10-CM | POA: Diagnosis not present

## 2022-04-24 DIAGNOSIS — M79674 Pain in right toe(s): Secondary | ICD-10-CM | POA: Diagnosis not present

## 2022-04-24 NOTE — Progress Notes (Signed)
  Subjective:  Patient ID: Kimberly Simmons, female    DOB: Apr 30, 1941,   MRN: [id number]  Chief Complaint  Patient presents with   Debridement    Trim toenails     81 y.o. female presents for concern of thickened elongated and painful nails that are difficult to trim. Requesting to have them trimmed today. Relates burning and tingling in their feet. Patient is diabetic and last A1c was unknown. Has been in the care of Dr. Lilian Kapur for flat feet and chronic left ankle pain.   PCP:  Diamantina Providence, FNP    . Denies any other pedal complaints. Denies n/v/f/c.   Past Medical History:  Diagnosis Date   Diabetes mellitus without complication (HCC)    Hypertension     Objective:  Physical Exam: Vascular: DP/PT pulses 2/4 bilateral. CFT <3 seconds. Absent hair growth on digits. Edema noted to bilateral lower extremities. Xerosis noted bilaterally.  Skin. No lacerations or abrasions bilateral feet. Nails 1-5 bilateral  are thickened discolored and elongated with subungual debris.  Musculoskeletal: MMT 5/5 bilateral lower extremities in DF, PF, Inversion and Eversion. Deceased ROM in DF of ankle joint. Pes planus bilateral and some tenderness along PT.  Neurological: Sensation intact to light touch. Protective sensation diminished bilateral.    Assessment:   1. Pain due to onychomycosis of toenails of both feet   2. Type 2 diabetes mellitus with diabetic polyneuropathy, unspecified whether long term insulin use (HCC)      Plan:  Patient was evaluated and treated and all questions answered. -Discussed and educated patient on diabetic foot care, especially with  regards to the vascular, neurological and musculoskeletal systems.  -Stressed the importance of good glycemic control and the detriment of not  controlling glucose levels in relation to the foot. -Discussed supportive shoes at all times and checking feet regularly.  -Mechanically debrided all nails 1-5 bilateral using sterile  nail nipper and filed with dremel without incident  -Answered all patient questions -Patient to return  in 3 months for at risk foot care -Patient advised to call the office if any problems or questions arise in the meantime.   Louann Sjogren, DPM

## 2022-05-09 ENCOUNTER — Encounter (INDEPENDENT_AMBULATORY_CARE_PROVIDER_SITE_OTHER): Payer: Medicare Other | Admitting: Ophthalmology

## 2022-05-10 ENCOUNTER — Encounter (INDEPENDENT_AMBULATORY_CARE_PROVIDER_SITE_OTHER): Payer: Medicare Other | Admitting: Ophthalmology

## 2022-05-10 DIAGNOSIS — H34812 Central retinal vein occlusion, left eye, with macular edema: Secondary | ICD-10-CM

## 2022-05-10 DIAGNOSIS — H43813 Vitreous degeneration, bilateral: Secondary | ICD-10-CM

## 2022-05-10 DIAGNOSIS — H35033 Hypertensive retinopathy, bilateral: Secondary | ICD-10-CM | POA: Diagnosis not present

## 2022-05-10 DIAGNOSIS — I10 Essential (primary) hypertension: Secondary | ICD-10-CM

## 2022-05-11 ENCOUNTER — Encounter (INDEPENDENT_AMBULATORY_CARE_PROVIDER_SITE_OTHER): Payer: Medicare Other | Admitting: Ophthalmology

## 2022-06-14 ENCOUNTER — Encounter (INDEPENDENT_AMBULATORY_CARE_PROVIDER_SITE_OTHER): Payer: Medicare Other | Admitting: Ophthalmology

## 2022-06-18 ENCOUNTER — Encounter (INDEPENDENT_AMBULATORY_CARE_PROVIDER_SITE_OTHER): Payer: Medicare Other | Admitting: Ophthalmology

## 2022-06-18 DIAGNOSIS — H35033 Hypertensive retinopathy, bilateral: Secondary | ICD-10-CM

## 2022-06-18 DIAGNOSIS — H43813 Vitreous degeneration, bilateral: Secondary | ICD-10-CM

## 2022-06-18 DIAGNOSIS — I10 Essential (primary) hypertension: Secondary | ICD-10-CM | POA: Diagnosis not present

## 2022-06-18 DIAGNOSIS — H34812 Central retinal vein occlusion, left eye, with macular edema: Secondary | ICD-10-CM | POA: Diagnosis not present

## 2022-07-16 ENCOUNTER — Encounter (INDEPENDENT_AMBULATORY_CARE_PROVIDER_SITE_OTHER): Payer: Medicare Other | Admitting: Ophthalmology

## 2022-07-18 ENCOUNTER — Encounter (INDEPENDENT_AMBULATORY_CARE_PROVIDER_SITE_OTHER): Payer: Medicare Other | Admitting: Ophthalmology

## 2022-07-18 DIAGNOSIS — H43813 Vitreous degeneration, bilateral: Secondary | ICD-10-CM | POA: Diagnosis not present

## 2022-07-18 DIAGNOSIS — H35033 Hypertensive retinopathy, bilateral: Secondary | ICD-10-CM | POA: Diagnosis not present

## 2022-07-18 DIAGNOSIS — H34812 Central retinal vein occlusion, left eye, with macular edema: Secondary | ICD-10-CM

## 2022-07-18 DIAGNOSIS — I10 Essential (primary) hypertension: Secondary | ICD-10-CM

## 2022-07-25 ENCOUNTER — Ambulatory Visit: Payer: Medicare Other | Admitting: Podiatry

## 2022-07-25 ENCOUNTER — Encounter (INDEPENDENT_AMBULATORY_CARE_PROVIDER_SITE_OTHER): Payer: Medicare Other | Admitting: Ophthalmology

## 2022-07-25 DIAGNOSIS — H34812 Central retinal vein occlusion, left eye, with macular edema: Secondary | ICD-10-CM

## 2022-08-08 ENCOUNTER — Ambulatory Visit: Payer: Medicare Other | Admitting: Podiatry

## 2022-08-13 ENCOUNTER — Ambulatory Visit (INDEPENDENT_AMBULATORY_CARE_PROVIDER_SITE_OTHER): Payer: Medicare Other | Admitting: Podiatry

## 2022-08-13 ENCOUNTER — Encounter: Payer: Self-pay | Admitting: Podiatry

## 2022-08-13 VITALS — BP 132/74

## 2022-08-13 DIAGNOSIS — M79674 Pain in right toe(s): Secondary | ICD-10-CM

## 2022-08-13 DIAGNOSIS — E1142 Type 2 diabetes mellitus with diabetic polyneuropathy: Secondary | ICD-10-CM

## 2022-08-13 DIAGNOSIS — M79675 Pain in left toe(s): Secondary | ICD-10-CM

## 2022-08-13 DIAGNOSIS — B351 Tinea unguium: Secondary | ICD-10-CM

## 2022-08-13 NOTE — Progress Notes (Signed)

## 2022-08-15 ENCOUNTER — Encounter (INDEPENDENT_AMBULATORY_CARE_PROVIDER_SITE_OTHER): Payer: Medicare Other | Admitting: Ophthalmology

## 2022-08-15 DIAGNOSIS — H34812 Central retinal vein occlusion, left eye, with macular edema: Secondary | ICD-10-CM | POA: Diagnosis not present

## 2022-08-15 DIAGNOSIS — H35033 Hypertensive retinopathy, bilateral: Secondary | ICD-10-CM

## 2022-08-15 DIAGNOSIS — H43813 Vitreous degeneration, bilateral: Secondary | ICD-10-CM

## 2022-08-15 DIAGNOSIS — I10 Essential (primary) hypertension: Secondary | ICD-10-CM | POA: Diagnosis not present

## 2022-09-12 ENCOUNTER — Encounter (INDEPENDENT_AMBULATORY_CARE_PROVIDER_SITE_OTHER): Payer: Medicare Other | Admitting: Ophthalmology

## 2022-09-17 ENCOUNTER — Encounter (INDEPENDENT_AMBULATORY_CARE_PROVIDER_SITE_OTHER): Payer: Medicare Other | Admitting: Ophthalmology

## 2022-09-17 DIAGNOSIS — H35033 Hypertensive retinopathy, bilateral: Secondary | ICD-10-CM | POA: Diagnosis not present

## 2022-09-17 DIAGNOSIS — H43813 Vitreous degeneration, bilateral: Secondary | ICD-10-CM | POA: Diagnosis not present

## 2022-09-17 DIAGNOSIS — I10 Essential (primary) hypertension: Secondary | ICD-10-CM | POA: Diagnosis not present

## 2022-09-17 DIAGNOSIS — H34812 Central retinal vein occlusion, left eye, with macular edema: Secondary | ICD-10-CM | POA: Diagnosis not present

## 2022-10-15 ENCOUNTER — Encounter (INDEPENDENT_AMBULATORY_CARE_PROVIDER_SITE_OTHER): Payer: Medicare Other | Admitting: Ophthalmology

## 2022-10-15 DIAGNOSIS — I10 Essential (primary) hypertension: Secondary | ICD-10-CM | POA: Diagnosis not present

## 2022-10-15 DIAGNOSIS — H35033 Hypertensive retinopathy, bilateral: Secondary | ICD-10-CM

## 2022-10-15 DIAGNOSIS — H43813 Vitreous degeneration, bilateral: Secondary | ICD-10-CM | POA: Diagnosis not present

## 2022-10-15 DIAGNOSIS — H34812 Central retinal vein occlusion, left eye, with macular edema: Secondary | ICD-10-CM

## 2022-11-12 ENCOUNTER — Encounter (INDEPENDENT_AMBULATORY_CARE_PROVIDER_SITE_OTHER): Payer: Medicare Other | Admitting: Ophthalmology

## 2022-11-13 ENCOUNTER — Encounter (INDEPENDENT_AMBULATORY_CARE_PROVIDER_SITE_OTHER): Payer: Medicare Other | Admitting: Ophthalmology

## 2022-11-13 DIAGNOSIS — H35033 Hypertensive retinopathy, bilateral: Secondary | ICD-10-CM

## 2022-11-13 DIAGNOSIS — I10 Essential (primary) hypertension: Secondary | ICD-10-CM | POA: Diagnosis not present

## 2022-11-13 DIAGNOSIS — H34812 Central retinal vein occlusion, left eye, with macular edema: Secondary | ICD-10-CM | POA: Diagnosis not present

## 2022-11-13 DIAGNOSIS — H43813 Vitreous degeneration, bilateral: Secondary | ICD-10-CM

## 2022-11-16 ENCOUNTER — Ambulatory Visit: Payer: Medicare Other | Admitting: Podiatry

## 2022-12-03 ENCOUNTER — Ambulatory Visit: Payer: Medicare Other | Admitting: Podiatry

## 2022-12-19 ENCOUNTER — Encounter (INDEPENDENT_AMBULATORY_CARE_PROVIDER_SITE_OTHER): Payer: Medicare Other | Admitting: Ophthalmology

## 2022-12-19 DIAGNOSIS — H34812 Central retinal vein occlusion, left eye, with macular edema: Secondary | ICD-10-CM | POA: Diagnosis not present

## 2022-12-19 DIAGNOSIS — H43813 Vitreous degeneration, bilateral: Secondary | ICD-10-CM

## 2022-12-19 DIAGNOSIS — I10 Essential (primary) hypertension: Secondary | ICD-10-CM | POA: Diagnosis not present

## 2022-12-19 DIAGNOSIS — H35033 Hypertensive retinopathy, bilateral: Secondary | ICD-10-CM

## 2023-01-08 ENCOUNTER — Emergency Department (HOSPITAL_BASED_OUTPATIENT_CLINIC_OR_DEPARTMENT_OTHER): Payer: Medicare Other

## 2023-01-08 ENCOUNTER — Encounter (HOSPITAL_BASED_OUTPATIENT_CLINIC_OR_DEPARTMENT_OTHER): Payer: Self-pay | Admitting: Urology

## 2023-01-08 ENCOUNTER — Emergency Department (HOSPITAL_BASED_OUTPATIENT_CLINIC_OR_DEPARTMENT_OTHER)
Admission: EM | Admit: 2023-01-08 | Discharge: 2023-01-08 | Disposition: A | Discharge status: Home / Self Care | Payer: Medicare Other | Attending: Emergency Medicine | Admitting: Emergency Medicine

## 2023-01-08 DIAGNOSIS — E119 Type 2 diabetes mellitus without complications: Secondary | ICD-10-CM | POA: Insufficient documentation

## 2023-01-08 DIAGNOSIS — M79605 Pain in left leg: Secondary | ICD-10-CM

## 2023-01-08 DIAGNOSIS — M79604 Pain in right leg: Secondary | ICD-10-CM | POA: Diagnosis not present

## 2023-01-08 DIAGNOSIS — Z79899 Other long term (current) drug therapy: Secondary | ICD-10-CM | POA: Insufficient documentation

## 2023-01-08 DIAGNOSIS — S8012XA Contusion of left lower leg, initial encounter: Secondary | ICD-10-CM | POA: Diagnosis not present

## 2023-01-08 DIAGNOSIS — S8991XA Unspecified injury of right lower leg, initial encounter: Secondary | ICD-10-CM | POA: Diagnosis present

## 2023-01-08 DIAGNOSIS — I10 Essential (primary) hypertension: Secondary | ICD-10-CM | POA: Diagnosis not present

## 2023-01-08 DIAGNOSIS — Z7982 Long term (current) use of aspirin: Secondary | ICD-10-CM | POA: Diagnosis not present

## 2023-01-08 DIAGNOSIS — Y92410 Unspecified street and highway as the place of occurrence of the external cause: Secondary | ICD-10-CM | POA: Insufficient documentation

## 2023-01-08 DIAGNOSIS — S8011XA Contusion of right lower leg, initial encounter: Secondary | ICD-10-CM | POA: Diagnosis not present

## 2023-01-08 LAB — CBC WITH DIFFERENTIAL/PLATELET
Abs Immature Granulocytes: 0.02 10*3/uL (ref 0.00–0.07)
Basophils Absolute: 0 10*3/uL (ref 0.0–0.1)
Basophils Relative: 0 %
Eosinophils Absolute: 0.2 10*3/uL (ref 0.0–0.5)
Eosinophils Relative: 3 %
HCT: 36.8 % (ref 36.0–46.0)
Hemoglobin: 13.1 g/dL (ref 12.0–15.0)
Immature Granulocytes: 0 %
Lymphocytes Relative: 30 %
Lymphs Abs: 2.5 10*3/uL (ref 0.7–4.0)
MCH: 27.7 pg (ref 26.0–34.0)
MCHC: 35.6 g/dL (ref 30.0–36.0)
MCV: 77.8 fL — ABNORMAL LOW (ref 80.0–100.0)
Monocytes Absolute: 0.8 10*3/uL (ref 0.1–1.0)
Monocytes Relative: 9 %
Neutro Abs: 4.8 10*3/uL (ref 1.7–7.7)
Neutrophils Relative %: 58 %
Platelets: 271 10*3/uL (ref 150–400)
RBC: 4.73 MIL/uL (ref 3.87–5.11)
RDW: 14.5 % (ref 11.5–15.5)
WBC: 8.4 10*3/uL (ref 4.0–10.5)
nRBC: 0 % (ref 0.0–0.2)

## 2023-01-08 LAB — BASIC METABOLIC PANEL
Anion gap: 9 (ref 5–15)
BUN: 28 mg/dL — ABNORMAL HIGH (ref 8–23)
CO2: 28 mmol/L (ref 22–32)
Calcium: 8.9 mg/dL (ref 8.9–10.3)
Chloride: 105 mmol/L (ref 98–111)
Creatinine, Ser: 1.2 mg/dL — ABNORMAL HIGH (ref 0.44–1.00)
GFR, Estimated: 45 mL/min — ABNORMAL LOW (ref >60)
Glucose, Bld: 120 mg/dL — ABNORMAL HIGH (ref 70–99)
Potassium: 3.7 mmol/L (ref 3.5–5.1)
Sodium: 142 mmol/L (ref 135–145)
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
## 2023-01-08 NOTE — ED Notes (Signed)
Pt A&Ox4 ambulatory at d/c with independent steady gait. Pt verbalized understanding of d/c instructions and follow up care. 
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
## 2023-01-08 NOTE — ED Notes (Signed)
Pt ambulatory to restroom with independent steady gait °
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
## 2023-01-08 NOTE — Discharge Instructions (Signed)
Please make your primary care appointment on Friday for recent symptoms and ER visit.  Today your labs and imaging were all negative for any fractures, blood clots, or any other worsening features.  You may take Tylenol every 6 hours as needed for pain however if symptoms worsen please return to ER.

## 2023-01-08 NOTE — ED Notes (Signed)
Patient transported to [facility] 

## 2023-01-30 ENCOUNTER — Encounter (INDEPENDENT_AMBULATORY_CARE_PROVIDER_SITE_OTHER): Payer: Medicare Other | Admitting: Ophthalmology

## 2023-01-30 DIAGNOSIS — H34812 Central retinal vein occlusion, left eye, with macular edema: Secondary | ICD-10-CM | POA: Diagnosis not present

## 2023-01-30 DIAGNOSIS — H43813 Vitreous degeneration, bilateral: Secondary | ICD-10-CM

## 2023-01-30 DIAGNOSIS — H35033 Hypertensive retinopathy, bilateral: Secondary | ICD-10-CM | POA: Diagnosis not present

## 2023-01-30 DIAGNOSIS — I10 Essential (primary) hypertension: Secondary | ICD-10-CM | POA: Diagnosis not present

## 2023-03-13 ENCOUNTER — Encounter (INDEPENDENT_AMBULATORY_CARE_PROVIDER_SITE_OTHER): Payer: Medicare Other | Admitting: Ophthalmology

## 2023-03-14 ENCOUNTER — Encounter (INDEPENDENT_AMBULATORY_CARE_PROVIDER_SITE_OTHER): Payer: Medicare Other | Admitting: Ophthalmology

## 2023-03-28 ENCOUNTER — Encounter (INDEPENDENT_AMBULATORY_CARE_PROVIDER_SITE_OTHER): Payer: Medicare Other | Admitting: Ophthalmology

## 2023-03-29 ENCOUNTER — Encounter (INDEPENDENT_AMBULATORY_CARE_PROVIDER_SITE_OTHER): Payer: Medicare Other | Admitting: Ophthalmology

## 2023-03-29 DIAGNOSIS — H34812 Central retinal vein occlusion, left eye, with macular edema: Secondary | ICD-10-CM | POA: Diagnosis not present

## 2023-03-29 DIAGNOSIS — H35033 Hypertensive retinopathy, bilateral: Secondary | ICD-10-CM | POA: Diagnosis not present

## 2023-03-29 DIAGNOSIS — I10 Essential (primary) hypertension: Secondary | ICD-10-CM

## 2023-03-29 DIAGNOSIS — H43813 Vitreous degeneration, bilateral: Secondary | ICD-10-CM | POA: Diagnosis not present

## 2023-05-10 ENCOUNTER — Encounter (INDEPENDENT_AMBULATORY_CARE_PROVIDER_SITE_OTHER): Payer: Medicare Other | Admitting: Ophthalmology

## 2023-05-10 DIAGNOSIS — H35033 Hypertensive retinopathy, bilateral: Secondary | ICD-10-CM | POA: Diagnosis not present

## 2023-05-10 DIAGNOSIS — H43813 Vitreous degeneration, bilateral: Secondary | ICD-10-CM

## 2023-05-10 DIAGNOSIS — H34812 Central retinal vein occlusion, left eye, with macular edema: Secondary | ICD-10-CM

## 2023-05-10 DIAGNOSIS — I10 Essential (primary) hypertension: Secondary | ICD-10-CM

## 2023-06-24 ENCOUNTER — Encounter (INDEPENDENT_AMBULATORY_CARE_PROVIDER_SITE_OTHER): Payer: Medicare Other | Admitting: Ophthalmology

## 2023-06-24 DIAGNOSIS — I10 Essential (primary) hypertension: Secondary | ICD-10-CM | POA: Diagnosis not present

## 2023-06-24 DIAGNOSIS — H43813 Vitreous degeneration, bilateral: Secondary | ICD-10-CM | POA: Diagnosis not present

## 2023-06-24 DIAGNOSIS — H35033 Hypertensive retinopathy, bilateral: Secondary | ICD-10-CM | POA: Diagnosis not present

## 2023-06-24 DIAGNOSIS — H34812 Central retinal vein occlusion, left eye, with macular edema: Secondary | ICD-10-CM

## 2023-08-07 ENCOUNTER — Encounter (INDEPENDENT_AMBULATORY_CARE_PROVIDER_SITE_OTHER): Payer: Medicare Other | Admitting: Ophthalmology

## 2023-08-08 ENCOUNTER — Encounter (INDEPENDENT_AMBULATORY_CARE_PROVIDER_SITE_OTHER): Payer: Medicare Other | Admitting: Ophthalmology

## 2023-08-08 DIAGNOSIS — H34812 Central retinal vein occlusion, left eye, with macular edema: Secondary | ICD-10-CM

## 2023-08-08 DIAGNOSIS — I10 Essential (primary) hypertension: Secondary | ICD-10-CM | POA: Diagnosis not present

## 2023-08-08 DIAGNOSIS — H43813 Vitreous degeneration, bilateral: Secondary | ICD-10-CM

## 2023-08-08 DIAGNOSIS — H35033 Hypertensive retinopathy, bilateral: Secondary | ICD-10-CM | POA: Diagnosis not present

## 2023-09-23 ENCOUNTER — Encounter (INDEPENDENT_AMBULATORY_CARE_PROVIDER_SITE_OTHER): Payer: Medicare Other | Admitting: Ophthalmology

## 2023-09-30 ENCOUNTER — Encounter (INDEPENDENT_AMBULATORY_CARE_PROVIDER_SITE_OTHER): Payer: Medicare Other | Admitting: Ophthalmology

## 2023-09-30 DIAGNOSIS — I10 Essential (primary) hypertension: Secondary | ICD-10-CM

## 2023-09-30 DIAGNOSIS — H34812 Central retinal vein occlusion, left eye, with macular edema: Secondary | ICD-10-CM | POA: Diagnosis not present

## 2023-09-30 DIAGNOSIS — H35033 Hypertensive retinopathy, bilateral: Secondary | ICD-10-CM | POA: Diagnosis not present

## 2023-11-11 ENCOUNTER — Encounter (INDEPENDENT_AMBULATORY_CARE_PROVIDER_SITE_OTHER): Payer: Medicare Other | Admitting: Ophthalmology

## 2023-11-11 DIAGNOSIS — I10 Essential (primary) hypertension: Secondary | ICD-10-CM | POA: Diagnosis not present

## 2023-11-11 DIAGNOSIS — H43813 Vitreous degeneration, bilateral: Secondary | ICD-10-CM

## 2023-11-11 DIAGNOSIS — H34812 Central retinal vein occlusion, left eye, with macular edema: Secondary | ICD-10-CM | POA: Diagnosis not present

## 2023-11-11 DIAGNOSIS — H35033 Hypertensive retinopathy, bilateral: Secondary | ICD-10-CM

## 2023-12-26 ENCOUNTER — Encounter (INDEPENDENT_AMBULATORY_CARE_PROVIDER_SITE_OTHER): Admitting: Ophthalmology

## 2023-12-30 ENCOUNTER — Encounter (INDEPENDENT_AMBULATORY_CARE_PROVIDER_SITE_OTHER): Admitting: Ophthalmology

## 2023-12-30 DIAGNOSIS — I10 Essential (primary) hypertension: Secondary | ICD-10-CM | POA: Diagnosis not present

## 2023-12-30 DIAGNOSIS — H35033 Hypertensive retinopathy, bilateral: Secondary | ICD-10-CM | POA: Diagnosis not present

## 2023-12-30 DIAGNOSIS — H43813 Vitreous degeneration, bilateral: Secondary | ICD-10-CM | POA: Diagnosis not present

## 2023-12-30 DIAGNOSIS — H34812 Central retinal vein occlusion, left eye, with macular edema: Secondary | ICD-10-CM | POA: Diagnosis not present

## 2024-02-10 ENCOUNTER — Encounter (INDEPENDENT_AMBULATORY_CARE_PROVIDER_SITE_OTHER): Admitting: Ophthalmology

## 2024-02-14 ENCOUNTER — Encounter (INDEPENDENT_AMBULATORY_CARE_PROVIDER_SITE_OTHER): Admitting: Ophthalmology

## 2024-02-17 ENCOUNTER — Encounter (INDEPENDENT_AMBULATORY_CARE_PROVIDER_SITE_OTHER): Admitting: Ophthalmology

## 2024-02-17 DIAGNOSIS — H43813 Vitreous degeneration, bilateral: Secondary | ICD-10-CM

## 2024-02-17 DIAGNOSIS — H35033 Hypertensive retinopathy, bilateral: Secondary | ICD-10-CM | POA: Diagnosis not present

## 2024-02-17 DIAGNOSIS — I10 Essential (primary) hypertension: Secondary | ICD-10-CM

## 2024-02-17 DIAGNOSIS — H34812 Central retinal vein occlusion, left eye, with macular edema: Secondary | ICD-10-CM | POA: Diagnosis not present

## 2024-03-23 ENCOUNTER — Encounter (INDEPENDENT_AMBULATORY_CARE_PROVIDER_SITE_OTHER): Admitting: Ophthalmology

## 2024-03-23 DIAGNOSIS — H35033 Hypertensive retinopathy, bilateral: Secondary | ICD-10-CM

## 2024-03-23 DIAGNOSIS — H2511 Age-related nuclear cataract, right eye: Secondary | ICD-10-CM

## 2024-03-23 DIAGNOSIS — I10 Essential (primary) hypertension: Secondary | ICD-10-CM

## 2024-03-23 DIAGNOSIS — H43813 Vitreous degeneration, bilateral: Secondary | ICD-10-CM | POA: Diagnosis not present

## 2024-03-23 DIAGNOSIS — H34812 Central retinal vein occlusion, left eye, with macular edema: Secondary | ICD-10-CM | POA: Diagnosis not present

## 2024-05-07 ENCOUNTER — Encounter (INDEPENDENT_AMBULATORY_CARE_PROVIDER_SITE_OTHER): Admitting: Ophthalmology

## 2024-05-07 DIAGNOSIS — H34812 Central retinal vein occlusion, left eye, with macular edema: Secondary | ICD-10-CM

## 2024-05-07 DIAGNOSIS — H43813 Vitreous degeneration, bilateral: Secondary | ICD-10-CM | POA: Diagnosis not present

## 2024-05-07 DIAGNOSIS — I10 Essential (primary) hypertension: Secondary | ICD-10-CM

## 2024-05-07 DIAGNOSIS — H35033 Hypertensive retinopathy, bilateral: Secondary | ICD-10-CM

## 2024-06-12 ENCOUNTER — Encounter (INDEPENDENT_AMBULATORY_CARE_PROVIDER_SITE_OTHER): Admitting: Ophthalmology

## 2024-06-12 DIAGNOSIS — H43813 Vitreous degeneration, bilateral: Secondary | ICD-10-CM

## 2024-06-12 DIAGNOSIS — H35033 Hypertensive retinopathy, bilateral: Secondary | ICD-10-CM | POA: Diagnosis not present

## 2024-06-12 DIAGNOSIS — I10 Essential (primary) hypertension: Secondary | ICD-10-CM | POA: Diagnosis not present

## 2024-06-12 DIAGNOSIS — H2511 Age-related nuclear cataract, right eye: Secondary | ICD-10-CM

## 2024-06-12 DIAGNOSIS — H34812 Central retinal vein occlusion, left eye, with macular edema: Secondary | ICD-10-CM

## 2024-06-15 ENCOUNTER — Encounter (INDEPENDENT_AMBULATORY_CARE_PROVIDER_SITE_OTHER): Admitting: Ophthalmology

## 2024-07-29 ENCOUNTER — Encounter (INDEPENDENT_AMBULATORY_CARE_PROVIDER_SITE_OTHER): Admitting: Ophthalmology

## 2024-07-29 DIAGNOSIS — H43813 Vitreous degeneration, bilateral: Secondary | ICD-10-CM

## 2024-07-29 DIAGNOSIS — I10 Essential (primary) hypertension: Secondary | ICD-10-CM

## 2024-07-29 DIAGNOSIS — H35033 Hypertensive retinopathy, bilateral: Secondary | ICD-10-CM

## 2024-07-29 DIAGNOSIS — H34812 Central retinal vein occlusion, left eye, with macular edema: Secondary | ICD-10-CM | POA: Diagnosis not present

## 2024-07-29 DIAGNOSIS — H2511 Age-related nuclear cataract, right eye: Secondary | ICD-10-CM

## 2024-09-11 ENCOUNTER — Encounter (INDEPENDENT_AMBULATORY_CARE_PROVIDER_SITE_OTHER): Admitting: Ophthalmology

## 2024-09-11 DIAGNOSIS — I10 Essential (primary) hypertension: Secondary | ICD-10-CM | POA: Diagnosis not present

## 2024-09-11 DIAGNOSIS — H43813 Vitreous degeneration, bilateral: Secondary | ICD-10-CM

## 2024-09-11 DIAGNOSIS — H35033 Hypertensive retinopathy, bilateral: Secondary | ICD-10-CM

## 2024-09-11 DIAGNOSIS — H34812 Central retinal vein occlusion, left eye, with macular edema: Secondary | ICD-10-CM

## 2024-10-29 ENCOUNTER — Encounter (INDEPENDENT_AMBULATORY_CARE_PROVIDER_SITE_OTHER): Admitting: Ophthalmology
